# Patient Record
Sex: Female | Born: 1965 | Race: White | Hispanic: No | Marital: Married | Smoking: Current every day smoker
Health system: Southern US, Community
[De-identification: ages and names within clinical notes are randomized; demographics above are authoritative.]

## PROBLEM LIST (undated history)

## (undated) DIAGNOSIS — F419 Anxiety disorder, unspecified: Secondary | ICD-10-CM

## (undated) DIAGNOSIS — I10 Essential (primary) hypertension: Secondary | ICD-10-CM

## (undated) DIAGNOSIS — E785 Hyperlipidemia, unspecified: Secondary | ICD-10-CM

## (undated) HISTORY — PX: WISDOM TOOTH EXTRACTION: SHX21

## (undated) HISTORY — PX: TUBAL LIGATION: SHX77

## (undated) HISTORY — DX: Hyperlipidemia, unspecified: E78.5

## (undated) HISTORY — DX: Essential (primary) hypertension: I10

## (undated) HISTORY — DX: Anxiety disorder, unspecified: F41.9

---

## 2004-05-17 ENCOUNTER — Emergency Department (HOSPITAL_COMMUNITY): Admission: EM | Admit: 2004-05-17 | Discharge: 2004-05-17 | Payer: Self-pay | Admitting: *Deleted

## 2004-10-23 ENCOUNTER — Inpatient Hospital Stay (HOSPITAL_COMMUNITY): Admission: AD | Admit: 2004-10-23 | Discharge: 2004-10-25 | Payer: Self-pay | Admitting: Obstetrics

## 2004-10-27 ENCOUNTER — Observation Stay (HOSPITAL_COMMUNITY): Admission: AD | Admit: 2004-10-27 | Discharge: 2004-10-28 | Payer: Self-pay | Admitting: Obstetrics

## 2004-10-29 ENCOUNTER — Observation Stay (HOSPITAL_COMMUNITY): Admission: AD | Admit: 2004-10-29 | Discharge: 2004-10-30 | Payer: Self-pay | Admitting: Obstetrics

## 2005-04-12 ENCOUNTER — Emergency Department (HOSPITAL_COMMUNITY): Admission: EM | Admit: 2005-04-12 | Discharge: 2005-04-12 | Payer: Self-pay | Admitting: Emergency Medicine

## 2006-12-15 ENCOUNTER — Emergency Department (HOSPITAL_COMMUNITY): Admission: EM | Admit: 2006-12-15 | Discharge: 2006-12-15 | Payer: Self-pay | Admitting: Emergency Medicine

## 2013-04-10 ENCOUNTER — Ambulatory Visit (INDEPENDENT_AMBULATORY_CARE_PROVIDER_SITE_OTHER): Payer: BC Managed Care – PPO | Admitting: Obstetrics

## 2013-04-10 ENCOUNTER — Other Ambulatory Visit: Payer: Self-pay | Admitting: Obstetrics

## 2013-04-10 ENCOUNTER — Encounter: Payer: Self-pay | Admitting: Obstetrics

## 2013-04-10 VITALS — BP 132/78 | HR 72 | Temp 97.5°F | Ht 64.0 in | Wt 118.0 lb

## 2013-04-10 DIAGNOSIS — Z01419 Encounter for gynecological examination (general) (routine) without abnormal findings: Secondary | ICD-10-CM

## 2013-04-10 DIAGNOSIS — N63 Unspecified lump in unspecified breast: Secondary | ICD-10-CM

## 2013-04-10 DIAGNOSIS — N760 Acute vaginitis: Secondary | ICD-10-CM

## 2013-04-10 DIAGNOSIS — Z0000 Encounter for general adult medical examination without abnormal findings: Secondary | ICD-10-CM

## 2013-04-10 DIAGNOSIS — D649 Anemia, unspecified: Secondary | ICD-10-CM

## 2013-04-10 LAB — CBC
HCT: 44.2 % (ref 36.0–46.0)
Hemoglobin: 15.1 g/dL — ABNORMAL HIGH (ref 12.0–15.0)
MCH: 30.9 pg (ref 26.0–34.0)
MCHC: 34.2 g/dL (ref 30.0–36.0)
MCV: 90.4 fL (ref 78.0–100.0)
Platelets: 306 10*3/uL (ref 150–400)
RBC: 4.89 MIL/uL (ref 3.87–5.11)
RDW: 14.7 % (ref 11.5–15.5)
WBC: 9.5 10*3/uL (ref 4.0–10.5)

## 2013-04-10 LAB — COMPREHENSIVE METABOLIC PANEL
ALT: 12 U/L (ref 0–35)
AST: 23 U/L (ref 0–37)
Albumin: 4.9 g/dL (ref 3.5–5.2)
Alkaline Phosphatase: 73 U/L (ref 39–117)
BUN: 9 mg/dL (ref 6–23)
CO2: 26 mEq/L (ref 19–32)
Calcium: 10 mg/dL (ref 8.4–10.5)
Chloride: 100 mEq/L (ref 96–112)
Creat: 0.67 mg/dL (ref 0.50–1.10)
Glucose, Bld: 73 mg/dL (ref 70–99)
Potassium: 4.6 mEq/L (ref 3.5–5.3)
Sodium: 137 mEq/L (ref 135–145)
Total Bilirubin: 0.4 mg/dL (ref 0.3–1.2)
Total Protein: 7.2 g/dL (ref 6.0–8.3)

## 2013-04-10 MED ORDER — PRENATAL PLUS 27-1 MG PO TABS: 1.0000 | ORAL_TABLET | Freq: Every day | ORAL | Status: DC

## 2013-04-10 NOTE — Progress Notes (Signed)
Subjective:     Patricia Andersen is a 47 y.o. female here for a routine exam.  Current complaints: tender right breast mass x 2 days.     Gynecologic History No LMP recorded. LMP 01/28/2013 Contraception: tubal ligation Last Pap: unknown. Results were: normal Last mammogram: n/a. Results were: n/a    The following portions of the patient's history were reviewed and updated as appropriate: allergies, current medications, past family history, past medical history, past social history, past surgical history and problem list.  Review of Systems Pertinent items are noted in HPI.    Objective:    General appearance: alert and no distress Breasts: Right breast mass.  10 o'clock, soft, mobile, NT, outer quadrant. Abdomen: normal findings: soft, non-tender Pelvic: cervix normal in appearance, external genitalia normal, no adnexal masses or tenderness, no cervical motion tenderness, uterus normal size, shape, and consistency and vagina normal without discharge    Assessment:    Healthy female exam.   Right breast lump.   Plan:    Education reviewed: calcium supplements and Breast lump management.. Mammogram ordered. Follow up in: prn year. [facility].

## 2013-04-10 NOTE — Addendum Note (Signed)
Addended by: Julaine Hua on: 04/10/2013 04:51 PM   Modules accepted: Orders

## 2013-04-10 NOTE — Addendum Note (Signed)
Addended by: Julaine Hua on: 04/10/2013 05:38 PM   Modules accepted: Orders

## 2013-04-11 LAB — PAP IG W/ RFLX HPV ASCU

## 2013-04-11 LAB — WET PREP BY MOLECULAR PROBE
Candida species: NEGATIVE
Gardnerella vaginalis: NEGATIVE
Trichomonas vaginosis: NEGATIVE

## 2013-04-11 LAB — VITAMIN D 25 HYDROXY (VIT D DEFICIENCY, FRACTURES): Vit D, 25-Hydroxy: 43 ng/mL (ref 30–89)

## 2013-04-11 LAB — TSH: TSH: 0.966 u[IU]/mL (ref 0.350–4.500)

## 2013-04-14 ENCOUNTER — Encounter: Payer: Self-pay | Admitting: Obstetrics & Gynecology

## 2013-04-22 ENCOUNTER — Ambulatory Visit: Payer: Self-pay | Admitting: Obstetrics

## 2013-04-23 ENCOUNTER — Ambulatory Visit
Admission: RE | Admit: 2013-04-23 | Discharge: 2013-04-23 | Disposition: A | Discharge status: Home / Self Care | Payer: BC Managed Care – PPO | Source: Ambulatory Visit | Attending: Obstetrics | Admitting: Obstetrics

## 2013-04-23 DIAGNOSIS — N63 Unspecified lump in unspecified breast: Secondary | ICD-10-CM

## 2013-05-13 ENCOUNTER — Encounter: Payer: Self-pay | Admitting: Obstetrics

## 2014-03-30 ENCOUNTER — Other Ambulatory Visit: Payer: Self-pay

## 2014-03-30 DIAGNOSIS — Z1231 Encounter for screening mammogram for malignant neoplasm of breast: Secondary | ICD-10-CM

## 2014-04-30 ENCOUNTER — Ambulatory Visit
Admission: RE | Admit: 2014-04-30 | Discharge: 2014-04-30 | Disposition: A | Discharge status: Home / Self Care | Payer: BC Managed Care – PPO | Source: Ambulatory Visit

## 2014-04-30 DIAGNOSIS — Z1231 Encounter for screening mammogram for malignant neoplasm of breast: Secondary | ICD-10-CM

## 2014-05-11 ENCOUNTER — Encounter: Payer: Self-pay | Admitting: Obstetrics

## 2014-05-11 ENCOUNTER — Ambulatory Visit (INDEPENDENT_AMBULATORY_CARE_PROVIDER_SITE_OTHER): Payer: BC Managed Care – PPO | Admitting: Obstetrics

## 2014-05-11 VITALS — BP 128/77 | HR 62 | Temp 98.8°F | Ht 64.0 in | Wt 120.0 lb

## 2014-05-11 DIAGNOSIS — D509 Iron deficiency anemia, unspecified: Secondary | ICD-10-CM

## 2014-05-11 DIAGNOSIS — Z01419 Encounter for gynecological examination (general) (routine) without abnormal findings: Secondary | ICD-10-CM

## 2014-05-11 DIAGNOSIS — R52 Pain, unspecified: Secondary | ICD-10-CM

## 2014-05-11 DIAGNOSIS — Z716 Tobacco abuse counseling: Secondary | ICD-10-CM

## 2014-05-11 MED ORDER — VARENICLINE TARTRATE 1 MG PO TABS: 1.0000 mg | ORAL_TABLET | Freq: Two times a day (BID) | ORAL | Status: DC

## 2014-05-11 MED ORDER — IBUPROFEN 800 MG PO TABS: 800.0000 mg | ORAL_TABLET | Freq: Three times a day (TID) | ORAL | Status: DC | PRN

## 2014-05-11 MED ORDER — VARENICLINE TARTRATE 0.5 MG X 11 & 1 MG X 42 PO MISC: ORAL | Status: DC

## 2014-05-11 MED ORDER — PRENATAL PLUS 27-1 MG PO TABS: 1.0000 | ORAL_TABLET | Freq: Every day | ORAL | Status: DC

## 2014-05-11 MED ORDER — VARENICLINE TARTRATE 1 MG PO TABS
1.0000 mg | ORAL_TABLET | Freq: Two times a day (BID) | ORAL | Status: DC
Start: 2014-05-11 — End: 2014-05-11

## 2014-05-12 ENCOUNTER — Encounter: Payer: Self-pay | Admitting: Obstetrics

## 2014-05-12 LAB — PAP IG AND HPV HIGH-RISK: HPV DNA High Risk: NOT DETECTED

## 2014-05-12 LAB — WET PREP BY MOLECULAR PROBE
Candida species: NEGATIVE
Gardnerella vaginalis: NEGATIVE
Trichomonas vaginosis: NEGATIVE

## 2014-05-12 NOTE — Progress Notes (Signed)
Subjective:     Patricia Andersen is a 48 y.o. female here for a routine exam.  Current complaints: None.    Personal health questionnaire:  Is patient [name], have a family history of breast and/or ovarian cancer: no Is there a family history of uterine cancer diagnosed at age < 86, gastrointestinal cancer, urinary tract cancer, family member who is a Field seismologist syndrome-associated carrier: no Is the patient overweight and hypertensive, family history of diabetes, personal history of gestational diabetes or PCOS: no Is patient over 92, have PCOS,  family history of premature CHD under age 45, diabetes, smoke, have hypertension or peripheral artery disease:  no  The HPI was reviewed and explored in further detail by the provider. Gynecologic History No LMP recorded. [name]. Contraception: tubal ligation Last Pap: 2014. Results were: normal Last mammogram: 2014. Results were: normal  Obstetric History OB History  Gravida Para Term Preterm AB SAB TAB Ectopic Multiple Living  5 4 3 1 1 1    3     # Outcome Date GA Lbr Len/2nd Weight Sex Delivery Anes PTL Lv  5 TRM 10/23/04 [redacted]w[redacted]d  6 lb 5 oz (2.863 kg) F LTCS Spinal  Y  4 TRM 02/18/90 [redacted]w[redacted]d  6 lb (2.722 kg) F LTCS Spinal  Y  3 PRE 1990 [redacted]w[redacted]d    SVD  Y ND  2 TRM 04/30/86 [redacted]w[redacted]d  6 lb (2.722 kg) M SVD EPI  Y  1 SAB               Past Medical History  Diagnosis Date  . Hypertension     Past Surgical History  Procedure Laterality Date  . Tubal ligation    . Wisdom tooth extraction    . Cesarean section      x 2    Current outpatient prescriptions:Cimetidine (ACID REDUCER PO), Take 1 tablet by mouth., Disp: , Rfl: ;  ibuprofen (ADVIL,MOTRIN) 200 MG tablet, Take 200 mg by mouth every 6 (six) hours as needed for pain., Disp: , Rfl: ;  prenatal vitamin w/FE, FA (PRENATAL 1 + 1) 27-1 MG TABS tablet, Take 1 tablet by mouth daily at 12 noon., Disp: 30 each, Rfl: 11 ibuprofen (ADVIL,MOTRIN) 800 MG tablet, Take 1 tablet  (800 mg total) by mouth every 8 (eight) hours as needed., Disp: 30 tablet, Rfl: 5;  varenicline (CHANTIX CONTINUING MONTH PAK) 1 MG tablet, Take 1 tablet (1 mg total) by mouth 2 (two) times daily., Disp: 60 tablet, Rfl: 1 varenicline (CHANTIX STARTING MONTH PAK) 0.5 MG X 11 & 1 MG X 42 tablet, Take one 0.5 mg tablet by mouth once daily for 3 days, then increase to one 0.5 mg tablet twice daily for 4 days, then increase to one 1 mg tablet twice daily., Disp: 53 tablet, Rfl: 2 varenicline (CHANTIX STARTING MONTH PAK) 0.5 MG X 11 & 1 MG X 42 tablet, Take one 0.5 mg tablet by mouth once daily for 3 days, then increase to one 0.5 mg tablet twice daily for 4 days, then increase to one 1 mg tablet twice daily., Disp: 53 tablet, Rfl: 0 No Known Allergies  History  Substance Use Topics  . Smoking status: Current Every Day Smoker -- 1.00 packs/day    Types: Cigarettes  . Smokeless tobacco: Never Used  . Alcohol Use: No    Family History  Problem Relation Age of Onset  . Cancer Father   . Heart attack Mother   . Hyperlipidemia Mother   .  Hypertension    . Heart disease Brother   . Cancer Sister       Review of Systems  Constitutional: negative for fatigue and weight loss Respiratory: negative for cough and wheezing Cardiovascular: negative for chest pain, fatigue and palpitations Gastrointestinal: negative for abdominal pain and change in bowel habits Musculoskeletal:negative for myalgias Neurological: negative for gait problems and tremors Behavioral/Psych: negative for abusive relationship, depression Endocrine: negative for temperature intolerance   Genitourinary:negative for abnormal menstrual periods, genital lesions, hot flashes, sexual problems and vaginal discharge Integument/breast: negative for breast lump, breast tenderness, nipple discharge and skin lesion(s)    Objective:       BP 128/77  Pulse 62  Temp(Src) 98.8 F (37.1 C)  Ht 5\' 4"  (1.626 m)  Wt 120 lb (54.432 kg)   BMI 20.59 kg/m2 General:   alert  Skin:   no rash or abnormalities  Lungs:   clear to auscultation bilaterally  Heart:   regular rate and rhythm, S1, S2 normal, no murmur, click, rub or gallop  Breasts:   normal without suspicious masses, skin or nipple changes or axillary nodes  Abdomen:  normal findings: no organomegaly, soft, non-tender and no hernia  Pelvis:  External genitalia: normal general appearance Urinary system: urethral meatus normal and bladder without fullness, nontender Vaginal: normal without tenderness, induration or masses Cervix: normal appearance Adnexa: normal bimanual exam Uterus: anteverted and non-tender, normal size   Lab Review Urine pregnancy test Labs reviewed yes Radiologic studies reviewed yes    Assessment:    Healthy female exam.    Plan:    Education reviewed: calcium supplements, low fat, low cholesterol diet, safe sex/STD prevention, self breast exams and weight bearing exercise. Mammogram ordered. Follow up in: 1 year.   Meds ordered this encounter  Medications  . DISCONTD: varenicline (CHANTIX STARTING MONTH PAK) 0.5 MG X 11 & 1 MG X 42 tablet    Sig: Take one 0.5 mg tablet by mouth once daily for 3 days, then increase to one 0.5 mg tablet twice daily for 4 days, then increase to one 1 mg tablet twice daily.    Dispense:  53 tablet    Refill:  0  . DISCONTD: varenicline (CHANTIX CONTINUING MONTH PAK) 1 MG tablet    Sig: Take 1 tablet (1 mg total) by mouth 2 (two) times daily.    Dispense:  60 tablet    Refill:  1  . prenatal vitamin w/FE, FA (PRENATAL 1 + 1) 27-1 MG TABS tablet    Sig: Take 1 tablet by mouth daily at 12 noon.    Dispense:  30 each    Refill:  11  . ibuprofen (ADVIL,MOTRIN) 800 MG tablet    Sig: Take 1 tablet (800 mg total) by mouth every 8 (eight) hours as needed.    Dispense:  30 tablet    Refill:  5  . DISCONTD: varenicline (CHANTIX STARTING MONTH PAK) 0.5 MG X 11 & 1 MG X 42 tablet    Sig: Take one 0.5 mg  tablet by mouth once daily for 3 days, then increase to one 0.5 mg tablet twice daily for 4 days, then increase to one 1 mg tablet twice daily.    Dispense:  53 tablet    Refill:  0  . DISCONTD: varenicline (CHANTIX CONTINUING MONTH PAK) 1 MG tablet    Sig: Take 1 tablet (1 mg total) by mouth 2 (two) times daily.    Dispense:  60 tablet  Refill:  1  . DISCONTD: varenicline (CHANTIX CONTINUING MONTH PAK) 1 MG tablet    Sig: Take 1 tablet (1 mg total) by mouth 2 (two) times daily.    Dispense:  60 tablet    Refill:  1  . DISCONTD: varenicline (CHANTIX STARTING MONTH PAK) 0.5 MG X 11 & 1 MG X 42 tablet    Sig: Take one 0.5 mg tablet by mouth once daily for 3 days, then increase to one 0.5 mg tablet twice daily for 4 days, then increase to one 1 mg tablet twice daily.    Dispense:  53 tablet    Refill:  0  . DISCONTD: varenicline (CHANTIX CONTINUING MONTH PAK) 1 MG tablet    Sig: Take 1 tablet (1 mg total) by mouth 2 (two) times daily.    Dispense:  60 tablet    Refill:  1  . varenicline (CHANTIX STARTING MONTH PAK) 0.5 MG X 11 & 1 MG X 42 tablet    Sig: Take one 0.5 mg tablet by mouth once daily for 3 days, then increase to one 0.5 mg tablet twice daily for 4 days, then increase to one 1 mg tablet twice daily.    Dispense:  53 tablet    Refill:  2  . varenicline (CHANTIX CONTINUING MONTH PAK) 1 MG tablet    Sig: Take 1 tablet (1 mg total) by mouth 2 (two) times daily.    Dispense:  60 tablet    Refill:  1  . varenicline (CHANTIX STARTING MONTH PAK) 0.5 MG X 11 & 1 MG X 42 tablet    Sig: Take one 0.5 mg tablet by mouth once daily for 3 days, then increase to one 0.5 mg tablet twice daily for 4 days, then increase to one 1 mg tablet twice daily.    Dispense:  53 tablet    Refill:  0   Orders Placed This Encounter  Procedures  . WET PREP BY MOLECULAR PROBE

## 2014-05-19 ENCOUNTER — Telehealth: Payer: Self-pay | Admitting: *Deleted

## 2014-05-19 NOTE — Telephone Encounter (Signed)
Patient called regarding lab results.   Patient notified that all labs preformed were Negative.

## 2014-06-22 ENCOUNTER — Telehealth: Payer: Self-pay | Admitting: *Deleted

## 2014-06-22 NOTE — Telephone Encounter (Signed)
Pharmacy called stating they are unable to get the PNV prescribed for the patient. Pharmacy states they have been trying for a month and have been unsuccessful. Pharmacy states they have prenatal plus Iron on hand. Pharmacy notified that would be fine to prescribe for patient that it seems close to what was prescribed. Pharmacy states they will put the order in.

## 2014-08-31 ENCOUNTER — Encounter: Payer: Self-pay | Admitting: Obstetrics

## 2014-09-15 ENCOUNTER — Emergency Department (HOSPITAL_COMMUNITY)
Admission: EM | Admit: 2014-09-15 | Discharge: 2014-09-15 | Disposition: A | Discharge status: Home / Self Care | Payer: BC Managed Care – PPO | Attending: Emergency Medicine | Admitting: Emergency Medicine

## 2014-09-15 ENCOUNTER — Encounter (HOSPITAL_COMMUNITY): Payer: Self-pay

## 2014-09-15 ENCOUNTER — Emergency Department (HOSPITAL_COMMUNITY): Payer: BC Managed Care – PPO

## 2014-09-15 DIAGNOSIS — Z79899 Other long term (current) drug therapy: Secondary | ICD-10-CM | POA: Insufficient documentation

## 2014-09-15 DIAGNOSIS — R079 Chest pain, unspecified: Secondary | ICD-10-CM | POA: Diagnosis not present

## 2014-09-15 DIAGNOSIS — Z720 Tobacco use: Secondary | ICD-10-CM | POA: Insufficient documentation

## 2014-09-15 DIAGNOSIS — I10 Essential (primary) hypertension: Secondary | ICD-10-CM | POA: Diagnosis not present

## 2014-09-15 LAB — BASIC METABOLIC PANEL WITH GFR
Anion gap: 12 (ref 5–15)
BUN: 14 mg/dL (ref 6–23)
CO2: 27 meq/L (ref 19–32)
Calcium: 10.3 mg/dL (ref 8.4–10.5)
Chloride: 99 meq/L (ref 96–112)
Creatinine, Ser: 0.7 mg/dL (ref 0.50–1.10)
GFR calc Af Amer: >90 mL/min
GFR calc non Af Amer: >90 mL/min
Glucose, Bld: 109 mg/dL — ABNORMAL HIGH (ref 70–99)
Potassium: 4.1 meq/L (ref 3.7–5.3)
Sodium: 138 meq/L (ref 137–147)

## 2014-09-15 LAB — CBC
HCT: 45.3 % (ref 36.0–46.0)
Hemoglobin: 15.5 g/dL — ABNORMAL HIGH (ref 12.0–15.0)
MCH: 31.7 pg (ref 26.0–34.0)
MCHC: 34.2 g/dL (ref 30.0–36.0)
MCV: 92.6 fL (ref 78.0–100.0)
Platelets: 327 10*3/uL (ref 150–400)
RBC: 4.89 MIL/uL (ref 3.87–5.11)
RDW: 12.5 % (ref 11.5–15.5)
WBC: 10.1 10*3/uL (ref 4.0–10.5)

## 2014-09-15 LAB — I-STAT TROPONIN, ED
Troponin i, poc: 0 ng/mL (ref 0.00–0.08)
Troponin i, poc: 0 ng/mL (ref 0.00–0.08)

## 2014-09-15 MED ORDER — GI COCKTAIL ~~LOC~~
30.0000 mL | Freq: Once | ORAL | Status: AC
  Administered 2014-09-15: 30 mL via ORAL
  Filled 2014-09-15: qty 30

## 2014-09-15 MED ORDER — PANTOPRAZOLE SODIUM 20 MG PO TBEC: 20.0000 mg | DELAYED_RELEASE_TABLET | Freq: Every day | ORAL | Status: DC

## 2014-09-15 NOTE — ED Notes (Signed)
Per pt, woke up with chest pain this morning.  Pt continued.  Pt states pain eased off and she became nauseated.  Also having tingling in right arm.

## 2014-09-15 NOTE — Discharge Instructions (Signed)
Return to the emergency room with worsening of symptoms, new symptoms or with symptoms that are concerning, especially chest pain that feels like a pressure, spreads to left arm or jaw, worse with exertion, associated with nausea, vomiting, shortness of breath and/or sweating.  Start PPI. You can use protonix as a script or if OTC omeprazole 20mg  daily for 14 days is cheaper you may use that instead. Call to make an appointment with cardiology as soon as possible for close follow-up.  Please call your doctor for a followup appointment within 24-48 hours. When you talk to your doctor please let them know that you were seen in the emergency department and have them acquire all of your records so that they can discuss the findings with you and formulate a treatment plan to fully care for your new and ongoing problems. If you do not have a primary care provider please call the number below under ED resources to establish care with a provider and follow up.    Emergency Department Resource Guide 1) Find a Doctor and Pay Out of Pocket Although you won't have to find out who is covered by your insurance plan, it is a good idea to ask around and get recommendations. You will then need to call the office and see if the doctor you have chosen will accept you as a new patient and what types of options they offer for patients who are self-pay. Some doctors offer discounts or will set up payment plans for their patients who do not have insurance, but you will need to ask so you aren't surprised when you get to your appointment.  2) Contact Your Local Health Department Not all health departments have doctors that can see patients for sick visits, but many do, so it is worth a call to see if yours does. If you don't know where your local health department is, you can check in your phone book. The CDC also has a tool to help you locate your state's health department, and many state websites also have listings of all of  their local health departments.  3) Find a [facility] If your illness is not likely to be very severe or complicated, you may want to try a walk in clinic. These are popping up all over the country in pharmacies, drugstores, and shopping centers. They're usually staffed by nurse practitioners or physician assistants that have been trained to treat common illnesses and complaints. They're usually fairly quick and inexpensive. However, if you have serious medical issues or chronic medical problems, these are probably not your best option.  No Primary Care Doctor: - Call Health Connect at  [phone] - they can help you locate a primary care doctor that  accepts your insurance, provides certain services, etc. - Physician Referral Service- [phone]  Chronic Pain Problems: Organization         Address  Phone   Notes  [facility]  [phone] Patients need to be referred by their primary care doctor.   Medication Assistance: Organization         Address  Phone   Notes  [facility] Medication [facility] [address] [place], [place] [address] [phone] --Must be a resident of [facility] -- Must have NO insurance coverage whatsoever (no Medicaid/ Medicare, etc.) -- The pt. MUST have a primary care doctor that directs their care regularly and follows them in the community   MedAssist  [phone]  [employer]  [phone]    Agencies that provide inexpensive medical care: Organization         Address  Phone   Notes  [place]  [phone]   [facility]    [phone]   [facility] [place], [place] [address] [phone]   [place] [address], [place] [phone]   Planned Parenthood    [phone]   [facility]    [phone]   [place] and [address],  [place] Phone:  [phone], Fax:  [phone] Hours of Operation:  9 am - 6 pm, M-F.  Also accepts Medicaid/Medicare and self-pay.  [facility] for [place] [address], [address], [place] Phone: [phone], Fax: [phone]. Hours of Operation:  8:30 am - 5:30 pm, M-F.  Also accepts Medicaid and self-pay.  [facility] [address], [place] Phone: [phone]   [place], [place], [place] [phone], Ext. [phone] Mondays & Thursdays: 7-9 AM.  First 15 patients are seen on a first come, first serve basis.    [place] Providers:  Organization         Address  Phone   Notes  [facility] [address], [place] [phone] Also accepts self-pay patients.  [facility] [address], [place]  [phone]   [place], [address], [place] [phone]   [facility] [address] [place] [phone]   Lucianne Lei [address], [place]   [phone] Only accepts [place] Access [place] patients after they have their name applied to their card.   Self-Pay (no insurance) in [facility]:  Organization         Address  Phone   Notes  Sickle Cell Patients, [facility] Internal [facility] [place] [phone]   [facility] [place] [phone]   [facility] [place]  [place], [place], [place] [phone]   [facility]/Dr. Osei-Bonsu  [address] [place] or [address], [place] [phone] Phone number for both [place] and [place] locations is the same.  [facility] [address], [place] [phone]   [facility] [address], [place] or [address] [phone]   [facility] [address], [place] [phone], phone; [phone], fax Sees patients 1st and 3rd Saturday of every month.  Must not qualify for public or private insurance (i.e. Medicaid, Medicare, [place] Health Choice, Veterans' Benefits)  Household income should be no more than 200% of the poverty level The clinic cannot treat you if you are pregnant or think you are pregnant  Sexually transmitted diseases are not treated at the clinic.    Dental Care: Organization         Address  Phone  Notes  [facility] Department of [facility] [phone] Accepts children up to age 57 who are enrolled in [place] or [place]; pregnant women with a Medicaid card;  and children who have applied for Medicaid or [place] Health Choice, but were declined, whose parents can pay a reduced fee at time of service.  [facility] Department of [facility]  [address], [place] [phone] Accepts children up to age 42 who are enrolled in [place] or [place]; pregnant women with a Medicaid card; and children who have applied for Medicaid or [place] Health Choice, but were declined, whose parents can pay a reduced fee at time of service.  [place] [facility] PRO[facility]  [place] [phone] Patients are seen by appointment only. Walk-ins are not accepted. [place] will see patients 65 years of age and older. Monday - Tuesday (8am-5pm) Most Wednesdays (8:30-5pm) $30 per visit, cash only  [facility] Adult Dental Access PROGRAM  [address], [facility] [phone] Patients are seen by appointment only. Walk-ins are not accepted. [place] will see patients 36 years of age and older. One Wednesday Evening (Monthly: Volunteer Based).  $30 per visit, cash only  [place]   [phone] for adults; Children under age 11, call Graduate Pediatric Dentistry at [phone]. Children aged 109-14, please call [phone] to request a pediatric application.  Dental services are provided in all areas of dental care including fillings, crowns and bridges, complete and partial dentures, implants, gum treatment, root canals, and extractions. Preventive care is also provided. Treatment is provided to both adults and children. Patients are selected via a lottery and there is often a waiting list.   [facility] [address], [place]  [phone] [email or web address]   [facility] [address] [place], [place] [phone], Ext. [phone] Second and Fourth Thursday of each month, opens at 6:30 AM; Clinic ends at 9 AM.  Patients are seen on a first-come first-served basis, and a limited number are seen during each clinic.   [facility]  [address] Hillard Danker [place], [place] [phone]   Eligibility Requirements You must have lived in [place], [place], or [place] counties for at least the last three months.   You cannot be eligible for state or federal sponsored [employer], including [employer], [place], or [employer].   You generally cannot be eligible for healthcare insurance through your employer.    How to apply: Eligibility screenings are held every Tuesday and Wednesday afternoon from 1:00 pm until 4:00 pm. You do not need an appointment for the interview!  [facility] [address] [place], [place]   [place]  [address] Department  [place]  [phone]    Behavioral Health Resources in the Community: Intensive Outpatient Programs Organization         Address  Phone  Notes  [place]. [address] [place], [place] [phone]   [facility] [address], [place], [place]   ADS: Alcohol & Drug Svcs [address], [place], [place]   [place] [address]  [place], [place] or [phone]   Substance Abuse Resources Organization         Address  Phone  Notes  Alcohol and Drug Services  [place]  [phone]   [facility] [place]   [facility]  [phone]  Residential & Outpatient Substance Abuse Program  [phone]   Psychological Services Organization         Address  Phone  Notes  [facility] [place]  [place]  [phone]   [place] [phone] [address] [place] or [phone]    Mobile Crisis Teams Organization         Address  Phone  Notes  [facility], Mobile Crisis Care Unit  [phone]   [facility]  [address] [place], [place]   [place] [address] [place] [phone]    Self-Help/Support Groups Organization         Address  Phone             Notes  [place]. of [place] - variety of support groups  [place] Call for more information  Narcotics Anonymous (NA), [facility] [address], [employer] [place]  2 meetings at this location   Special educational needs teacher         Address  Phone  Notes  [facility],    [place]  [phone]   [facility]  [address] [place] [phone], [place], [place]   [place], [place] [phone] Admissions: 8am-3pm M-F  Incentives Substance [place] [address],    [place], [place] [phone]   [facility] [address] [place], [place], [place]   [facility] [address]  [place], [place]     [facility] - Intensive Outpatient [address], [place], [place]   [facility] [address]  [place], [place] [phone] or [phone]   [facility] ([facility]) [address], [place], [place] Accepts Medicaid  [facility] [place] [address],  [place] [phone] Substance Abuse/Addiction Treatment   [facility] Organization         Address  Phone  Notes  [facility]  [phone]   Domenic Schwab, PhD [address] Arlis Porta [place], [place]   [phone] or [phone]   [place], [place] [phone]   [facility] [address] [place], [place] [phone] Insurance/Medicaid/sponsorship through [place] [facility] and Families [address], [address] [place]                                    [place], [place] [phone] [place] [address], [place] [phone]    Dr. Adele Schilder  [phone]   [facility] Dept. 1) [address] [place] 2) [place] 3)  [place] [address], [place] [phone]  [phone]   [place] [phone] or [phone] (After Hours)       Chest Pain (Nonspecific) It is often hard to give a specific diagnosis for the cause of chest pain. There is always a chance that your pain could be related to something serious, such as a heart attack or a blood clot in the lungs. You need to follow up with your health care provider for further  evaluation. CAUSES   Heartburn.  Pneumonia or bronchitis.  Anxiety or stress.  Inflammation around your heart (pericarditis) or lung (pleuritis or pleurisy).  A blood clot in the lung.  A collapsed lung (pneumothorax). It can develop suddenly on its own  (spontaneous pneumothorax) or from trauma to the chest.  Shingles infection (herpes zoster virus). The chest wall is composed of bones, muscles, and cartilage. Any of these can be the source of the pain.  The bones can be bruised by injury.  The muscles or cartilage can be strained by coughing or overwork.  The cartilage can be affected by inflammation and become sore (costochondritis). DIAGNOSIS  Lab tests or other studies may be needed to find the cause of your pain. Your health care provider may have you take a test called an ambulatory electrocardiogram (ECG). An ECG records your heartbeat patterns over a 24-hour period. You may also have other tests, such as:  Transthoracic echocardiogram (TTE). During echocardiography, sound waves are used to evaluate how blood flows through your heart.  Transesophageal echocardiogram (TEE).  Cardiac monitoring. This allows your health care provider to monitor your heart rate and rhythm in real time.  Holter monitor. This is a portable device that records your heartbeat and can help diagnose heart arrhythmias. It allows your health care provider to track your heart activity for several days, if needed.  Stress tests by exercise or by giving medicine that makes the heart beat faster. TREATMENT   Treatment depends on what may be causing your chest pain. Treatment may include:  Acid blockers for heartburn.  Anti-inflammatory medicine.  Pain medicine for inflammatory conditions.  Antibiotics if an infection is present.  You may be advised to change lifestyle habits. This includes stopping smoking and avoiding alcohol, caffeine, and chocolate.  You may be advised to keep your head raised (elevated) when sleeping. This reduces the chance of acid going backward from your stomach into your esophagus. Most of the time, nonspecific chest pain will improve within 2-3 days with rest and mild pain medicine.  HOME CARE INSTRUCTIONS   If antibiotics  were prescribed, take them as directed. Finish them even if you start to feel better.  For the next few days, avoid physical activities that bring on chest pain. Continue physical activities as directed.  Do not use any tobacco products, including cigarettes, chewing tobacco, or electronic cigarettes.  Avoid drinking alcohol.  Only take medicine as directed by your health care provider.  Follow your health care provider's suggestions for further testing if your chest pain does not go away.  Keep any follow-up appointments you made. If you do not go to an appointment, you could develop lasting (chronic) problems with pain. If there is any problem keeping an appointment, call to reschedule. SEEK MEDICAL CARE IF:   Your chest pain does not go away, even after treatment.  You have a rash with blisters on your chest.  You have a fever. SEEK IMMEDIATE MEDICAL CARE IF:   You have increased chest pain or pain that spreads to your arm, neck, jaw, back, or abdomen.  You have shortness of breath.  You have an increasing cough, or you cough up blood.  You have severe back or abdominal pain.  You feel nauseous or vomit.  You have severe weakness.  You faint.  You have chills. This is an emergency. Do not wait to see if the pain will go away. Get medical help at once. Call your local emergency services (911 in  U.S.). Do not drive yourself to the hospital. MAKE SURE YOU:   Understand these instructions.  Will watch your condition.  Will get help right away if you are not doing well or get worse. Document Released: 07/26/2005 Document Revised: 10/21/2013 Document Reviewed: 05/21/2008 [facility] Patient Information 2015 [place], [place]. This information is not intended to replace advice given to you by your health care provider. Make sure you discuss any questions you have with your health care provider.

## 2014-09-15 NOTE — ED Provider Notes (Signed)
CSN: [id number]     Arrival date & time 09/15/14  1515 History   First MD Initiated Contact with Patient 09/15/14 1728     Chief Complaint  Patient presents with  . Chest Pain     (Consider location/radiation/quality/duration/timing/severity/associated sxs/prior Treatment) HPI  Patricia Andersen is a 48 y.o. female with PMH of HTN presenting with  left-sided chest pain that woke her from sleep around 4 AM this morning. Pain was sharp and now is an ache. Pain has been persistent with gradual improvement. Patient also stated at onset she felt nauseated but denies shortness of breath or diaphoresis. Pain improved with belching. She still with pain in ED. She denies worsening with activity, movement. She also has complaint of tingling in right arm that has resolved. Patient without a history of chest pain and has never been evaluated by cardiology. No history of dyslipidemia, diabetes, family history of sudden cardiac death. Patient smokes daily. No history of malignancy, no recent trauma or surgery, unilateral leg swelling or tenderness, hemoptysis, estrogen use.   Past Medical History  Diagnosis Date  . Hypertension    Past Surgical History  Procedure Laterality Date  . Tubal ligation    . Wisdom tooth extraction    . Cesarean section      x 2   Family History  Problem Relation Age of Onset  . Cancer Father   . Heart attack Mother   . Hyperlipidemia Mother   . Hypertension    . Heart disease Brother   . Cancer Sister    History  Substance Use Topics  . Smoking status: Current Every Day Smoker -- 1.00 packs/day    Types: Cigarettes  . Smokeless tobacco: Never Used  . Alcohol Use: No   OB History    Gravida Para Term Preterm AB TAB SAB Ectopic Multiple Living   5 4 3 1 1  1   3      Review of Systems  Constitutional: Negative for fever and chills.  HENT: Negative for congestion and rhinorrhea.   Eyes: Negative for visual disturbance.  Respiratory: Negative for cough and  shortness of breath.   Cardiovascular: Positive for chest pain. Negative for leg swelling.  Gastrointestinal: Negative for nausea, vomiting and diarrhea.  Musculoskeletal: Negative for back pain and gait problem.  Skin: Negative for rash.  Neurological: Negative for weakness and headaches.      Allergies  Review of patient's allergies indicates no known allergies.  Home Medications   Prior to Admission medications   Medication Sig Start Date End Date Taking? Authorizing Provider  Cimetidine (ACID REDUCER PO) Take 1 tablet by mouth daily.    Yes Historical Provider, MD  ibuprofen (ADVIL,MOTRIN) 800 MG tablet Take 1 tablet (800 mg total) by mouth every 8 (eight) hours as needed. 05/11/14  Yes Shelly Bombard, MD  prenatal vitamin w/FE, FA (PRENATAL 1 + 1) 27-1 MG TABS tablet Take 1 tablet by mouth daily at 12 noon. 05/11/14  Yes Shelly Bombard, MD  ibuprofen (ADVIL,MOTRIN) 200 MG tablet Take 200 mg by mouth every 6 (six) hours as needed for pain.    Historical Provider, MD  pantoprazole (PROTONIX) 20 MG tablet Take 1 tablet (20 mg total) by mouth daily. 09/15/14   Pura Spice, PA-C  varenicline (CHANTIX CONTINUING MONTH PAK) 1 MG tablet Take 1 tablet (1 mg total) by mouth 2 (two) times daily. 05/11/14   Shelly Bombard, MD  varenicline (CHANTIX STARTING MONTH PAK) 0.5 MG X  11 & 1 MG X 42 tablet Take one 0.5 mg tablet by mouth once daily for 3 days, then increase to one 0.5 mg tablet twice daily for 4 days, then increase to one 1 mg tablet twice daily. 05/11/14   Shelly Bombard, MD  varenicline (CHANTIX STARTING MONTH PAK) 0.5 MG X 11 & 1 MG X 42 tablet Take one 0.5 mg tablet by mouth once daily for 3 days, then increase to one 0.5 mg tablet twice daily for 4 days, then increase to one 1 mg tablet twice daily. 05/11/14   Shelly Bombard, MD   BP 123/64 mmHg  Pulse 57  Temp(Src) 98.4 F (36.9 C) (Oral)  Resp 18  SpO2 97% Physical Exam  Constitutional: She appears well-developed  and well-nourished. No distress.  HENT:  Head: Normocephalic and atraumatic.  Eyes: Conjunctivae and EOM are normal. Right eye exhibits no discharge. Left eye exhibits no discharge.  Neck: No JVD present.  Cardiovascular: Normal rate, regular rhythm and normal heart sounds.   No leg swelling or tenderness. Negative Homan's sign. Reproducible chest pain.  Pulmonary/Chest: Effort normal and breath sounds normal. No respiratory distress. She has no wheezes.  Abdominal: Soft. Bowel sounds are normal. She exhibits no distension. There is no tenderness.  Neurological: She is alert. She exhibits normal muscle tone. Coordination normal.  Skin: Skin is warm and dry. She is not diaphoretic.  Nursing note and vitals reviewed.   ED Course  Procedures (including critical care time) Labs Review Labs Reviewed  CBC - Abnormal; Notable for the following:    Hemoglobin 15.5 (*)    All other components within normal limits  BASIC METABOLIC PANEL - Abnormal; Notable for the following:    Glucose, Bld 109 (*)    All other components within normal limits  I-STAT TROPOININ, ED  I-STAT TROPOININ, ED    Imaging Review Dg Chest 2 View  09/15/2014   CLINICAL DATA:  Mid chest pain, right arm tingling, nausea since this morning  EXAM: CHEST - 2 VIEW  COMPARISON:  12/15/2006  FINDINGS: Lungs are clear. Prominent nipple shadows. Heart size and mediastinal contours are within normal limits. No effusion. Visualized skeletal structures are unremarkable.  IMPRESSION: No acute cardiopulmonary disease.   Electronically Signed   By: Arne Cleveland M.D.   On: 09/15/2014 19:06     EKG Interpretation   Date/Time:  Tuesday September 15 2014 15:21:53 EST Ventricular Rate:  87 PR Interval:  120 QRS Duration: 85 QT Interval:  354 QTC Calculation: 426 R Axis:   89 Text Interpretation:  Sinus rhythm Right atrial enlargement Probable LVH  with secondary repol abnrm Rate increased from prior,  Confirmed by  DOCHERTY   MD, [place] ([id number]) on 09/15/2014 3:28:03 PM      MDM   Final diagnoses:  Chest pain   Chest pain is not likely of cardiac or pulmonary etiology d/t presentation, PERC negative, low risk HEART score. VSS, no JVD or new murmur, RRR, breath sounds equal bilaterally, EKG without acute abnormalities, negative troponin and delta troponin, and negative CXR. Pt has been advised start a PPI and return to the ED if CP becomes exertional, associated with diaphoresis or nausea, radiates to left jaw/arm, worsens or becomes concerning in any way. Patient to follow up with cardiology for outpatient workup. ED resources provided. Pt appears reliable for follow up and is agreeable to discharge.   Discussed return precautions with patient. Discussed all results and patient verbalizes understanding and agrees with  plan.  This is a shared patient. This patient was discussed with the physician who saw and evaluated the patient and agrees with the plan.       Pura Spice, PA-C 09/15/14 1948  Ernestina Patches, MD 09/16/14 [phone]

## 2014-10-30 HISTORY — PX: BREAST CYST ASPIRATION: SHX578

## 2015-04-19 ENCOUNTER — Other Ambulatory Visit: Payer: Self-pay

## 2015-04-19 DIAGNOSIS — Z1231 Encounter for screening mammogram for malignant neoplasm of breast: Secondary | ICD-10-CM

## 2015-05-07 ENCOUNTER — Ambulatory Visit
Admission: RE | Admit: 2015-05-07 | Discharge: 2015-05-07 | Disposition: A | Discharge status: Home / Self Care | Payer: BLUE CROSS/BLUE SHIELD | Source: Ambulatory Visit

## 2015-05-07 ENCOUNTER — Other Ambulatory Visit: Payer: Self-pay | Admitting: Obstetrics

## 2015-05-07 DIAGNOSIS — Z1231 Encounter for screening mammogram for malignant neoplasm of breast: Secondary | ICD-10-CM

## 2015-05-07 DIAGNOSIS — R928 Other abnormal and inconclusive findings on diagnostic imaging of breast: Secondary | ICD-10-CM

## 2015-05-13 ENCOUNTER — Ambulatory Visit
Admission: RE | Admit: 2015-05-13 | Discharge: 2015-05-13 | Disposition: A | Discharge status: Home / Self Care | Payer: BLUE CROSS/BLUE SHIELD | Source: Ambulatory Visit | Attending: Obstetrics | Admitting: Obstetrics

## 2015-05-13 ENCOUNTER — Other Ambulatory Visit: Payer: Self-pay | Admitting: Obstetrics

## 2015-05-13 ENCOUNTER — Other Ambulatory Visit (HOSPITAL_COMMUNITY)
Admission: RE | Admit: 2015-05-13 | Discharge: 2015-05-13 | Disposition: A | Discharge status: Home / Self Care | Payer: BLUE CROSS/BLUE SHIELD | Source: Ambulatory Visit | Attending: Diagnostic Radiology | Admitting: Diagnostic Radiology

## 2015-05-13 DIAGNOSIS — N63 Unspecified lump in breast: Secondary | ICD-10-CM | POA: Insufficient documentation

## 2015-05-13 DIAGNOSIS — R928 Other abnormal and inconclusive findings on diagnostic imaging of breast: Secondary | ICD-10-CM

## 2015-05-17 ENCOUNTER — Other Ambulatory Visit: Payer: BLUE CROSS/BLUE SHIELD

## 2015-05-20 ENCOUNTER — Encounter: Payer: Self-pay | Admitting: Obstetrics

## 2015-05-20 ENCOUNTER — Ambulatory Visit (INDEPENDENT_AMBULATORY_CARE_PROVIDER_SITE_OTHER): Payer: BLUE CROSS/BLUE SHIELD | Admitting: Obstetrics

## 2015-05-20 ENCOUNTER — Ambulatory Visit: Payer: Self-pay | Admitting: Obstetrics

## 2015-05-20 VITALS — BP 142/81 | HR 67 | Temp 98.1°F | Ht 64.0 in | Wt 122.0 lb

## 2015-05-20 DIAGNOSIS — Z01419 Encounter for gynecological examination (general) (routine) without abnormal findings: Secondary | ICD-10-CM

## 2015-05-20 DIAGNOSIS — Z124 Encounter for screening for malignant neoplasm of cervix: Secondary | ICD-10-CM

## 2015-05-20 NOTE — Progress Notes (Signed)
Subjective:        Patricia Andersen is a 49 y.o. female here for a routine exam.  Current complaints: none.    Personal health questionnaire:  Is patient [name], have a family history of breast and/or ovarian cancer: no Is there a family history of uterine cancer diagnosed at age < 60, gastrointestinal cancer, urinary tract cancer, family member who is a Field seismologist syndrome-associated carrier: no Is the patient overweight and hypertensive, family history of diabetes, personal history of gestational diabetes, preeclampsia or PCOS: no Is patient over 24, have PCOS,  family history of premature CHD under age 64, diabetes, smoke, have hypertension or peripheral artery disease:  no At any time, has a partner hit, kicked or otherwise hurt or frightened you?: no Over the past 2 weeks, have you felt down, depressed or hopeless?: no Over the past 2 weeks, have you felt little interest or pleasure in doing things?:no   Gynecologic History No LMP recorded. [name]. Contraception: tubal ligation Last Pap: 2013. Results were: normal Last mammogram: 2016. Results were: benign cyst  Obstetric History OB History  Gravida Para Term Preterm AB SAB TAB Ectopic Multiple Living  5 4 3 1 1 1    3     # Outcome Date GA Lbr Len/2nd Weight Sex Delivery Anes PTL Lv  5 Term 10/23/04 [redacted]w[redacted]d  6 lb 5 oz (2.863 kg) F CS-LTranv Spinal  Y  4 Term 02/18/90 [redacted]w[redacted]d  6 lb (2.722 kg) F CS-LTranv Spinal  Y  3 Preterm 1990 [redacted]w[redacted]d    Vag-Spont  Y ND  2 Term 04/30/86 [redacted]w[redacted]d  6 lb (2.722 kg) M Vag-Spont EPI  Y  1 SAB               Past Medical History  Diagnosis Date  . Hypertension     Past Surgical History  Procedure Laterality Date  . Tubal ligation    . Wisdom tooth extraction    . Cesarean section      x 2     Current outpatient prescriptions:  .  Cimetidine (ACID REDUCER PO), Take 1 tablet by mouth daily. , Disp: , Rfl:  .  pantoprazole (PROTONIX) 20 MG tablet, Take 1 tablet (20 mg  total) by mouth daily., Disp: 14 tablet, Rfl: 0 .  prenatal vitamin w/FE, FA (PRENATAL 1 + 1) 27-1 MG TABS tablet, Take 1 tablet by mouth daily at 12 noon., Disp: 30 each, Rfl: 11 .  ibuprofen (ADVIL,MOTRIN) 200 MG tablet, Take 200 mg by mouth every 6 (six) hours as needed for pain., Disp: , Rfl:  .  ibuprofen (ADVIL,MOTRIN) 800 MG tablet, Take 1 tablet (800 mg total) by mouth every 8 (eight) hours as needed. (Patient not taking: Reported on 05/20/2015), Disp: 30 tablet, Rfl: 5 No Known Allergies  History  Substance Use Topics  . Smoking status: Current Every Day Smoker -- 1.00 packs/day    Types: Cigarettes  . Smokeless tobacco: Never Used  . Alcohol Use: No    Family History  Problem Relation Age of Onset  . Cancer Father   . Heart attack Mother   . Hyperlipidemia Mother   . Hypertension    . Heart disease Brother   . Cancer Sister       Review of Systems  Constitutional: negative for fatigue and weight loss Respiratory: negative for cough and wheezing Cardiovascular: negative for chest pain, fatigue and palpitations Gastrointestinal: negative for abdominal pain and change in bowel habits Musculoskeletal:negative  for myalgias Neurological: negative for gait problems and tremors Behavioral/Psych: negative for abusive relationship, depression Endocrine: negative for temperature intolerance   Genitourinary:negative for abnormal menstrual periods, genital lesions, hot flashes, sexual problems and vaginal discharge Integument/breast: negative for breast lump, breast tenderness, nipple discharge and skin lesion(s)    Objective:       BP 142/81 mmHg  Pulse 67  Temp(Src) 98.1 F (36.7 C)  Ht 5\' 4"  (1.626 m)  Wt 122 lb (55.339 kg)  BMI 20.93 kg/m2 General:   alert  Skin:   no rash or abnormalities  Lungs:   clear to auscultation bilaterally  Heart:   regular rate and rhythm, S1, S2 normal, no murmur, click, rub or gallop  Breasts:   normal without suspicious masses, skin or  nipple changes or axillary nodes  Abdomen:  normal findings: no organomegaly, soft, non-tender and no hernia  Pelvis:  External genitalia: normal general appearance Urinary system: urethral meatus normal and bladder without fullness, nontender Vaginal: normal without tenderness, induration or masses Cervix: normal appearance Adnexa: normal bimanual exam Uterus: anteverted and non-tender, normal size   Lab Review Urine pregnancy test Labs reviewed yes Radiologic studies reviewed yes    Assessment:    Healthy female exam.    Plan:    Education reviewed: calcium supplements, self breast exams, smoking cessation and weight bearing exercise. Follow up in: 1 year.   No orders of the defined types were placed in this encounter.   Orders Placed This Encounter  Procedures  . SureSwab, Vaginosis/Vaginitis Plus

## 2015-05-21 LAB — PAP IG AND HPV HIGH-RISK: HPV DNA High Risk: NOT DETECTED

## 2015-05-23 LAB — SURESWAB, VAGINOSIS/VAGINITIS PLUS
Atopobium vaginae: NOT DETECTED Log (cells/mL)
C. albicans, DNA: NOT DETECTED
C. glabrata, DNA: NOT DETECTED
C. parapsilosis, DNA: NOT DETECTED
C. trachomatis RNA, TMA: NOT DETECTED
C. tropicalis, DNA: NOT DETECTED
Gardnerella vaginalis: NOT DETECTED Log (cells/mL)
LACTOBACILLUS SPECIES: NOT DETECTED Log (cells/mL)
MEGASPHAERA SPECIES: NOT DETECTED Log (cells/mL)
N. gonorrhoeae RNA, TMA: NOT DETECTED
T. vaginalis RNA, QL TMA: NOT DETECTED

## 2015-06-07 ENCOUNTER — Other Ambulatory Visit: Payer: Self-pay | Admitting: Obstetrics

## 2015-09-21 ENCOUNTER — Other Ambulatory Visit: Payer: Self-pay | Admitting: Obstetrics

## 2015-10-20 ENCOUNTER — Other Ambulatory Visit: Payer: Self-pay | Admitting: Obstetrics

## 2015-12-16 ENCOUNTER — Other Ambulatory Visit: Payer: Self-pay | Admitting: Obstetrics

## 2016-01-24 ENCOUNTER — Other Ambulatory Visit: Payer: Self-pay | Admitting: Obstetrics

## 2016-01-26 NOTE — Telephone Encounter (Signed)
Please review for refill.  

## 2016-02-29 ENCOUNTER — Other Ambulatory Visit: Payer: Self-pay | Admitting: Obstetrics

## 2016-04-10 ENCOUNTER — Other Ambulatory Visit: Payer: Self-pay | Admitting: Obstetrics

## 2016-04-10 DIAGNOSIS — Z1231 Encounter for screening mammogram for malignant neoplasm of breast: Secondary | ICD-10-CM

## 2016-06-28 ENCOUNTER — Ambulatory Visit (INDEPENDENT_AMBULATORY_CARE_PROVIDER_SITE_OTHER): Payer: BLUE CROSS/BLUE SHIELD | Admitting: Obstetrics

## 2016-06-28 ENCOUNTER — Encounter: Payer: Self-pay | Admitting: Obstetrics

## 2016-06-28 VITALS — BP 148/75 | HR 82 | Ht 65.0 in | Wt 119.0 lb

## 2016-06-28 DIAGNOSIS — Z01419 Encounter for gynecological examination (general) (routine) without abnormal findings: Secondary | ICD-10-CM

## 2016-06-28 DIAGNOSIS — Z124 Encounter for screening for malignant neoplasm of cervix: Secondary | ICD-10-CM

## 2016-06-28 NOTE — Progress Notes (Signed)
Patient ID: Patricia Andersen, female   DOB: 08/15/1966, 50 y.o.   MRN: [id number]   Subjective:        Patricia Andersen is a 50 y.o. female here for a routine exam.  Current complaints: None.    Personal health questionnaire:  Is patient [name], have a family history of breast and/or ovarian cancer: no Is there a family history of uterine cancer diagnosed at age < 4, gastrointestinal cancer, urinary tract cancer, family member who is a Field seismologist syndrome-associated carrier: no Is the patient overweight and hypertensive, family history of diabetes, personal history of gestational diabetes, preeclampsia or PCOS: no Is patient over 49, have PCOS,  family history of premature CHD under age 81, diabetes, smoke, have hypertension or peripheral artery disease:  no At any time, has a partner hit, kicked or otherwise hurt or frightened you?: no Over the past 2 weeks, have you felt down, depressed or hopeless?: no Over the past 2 weeks, have you felt little interest or pleasure in doing things?:no   Gynecologic History No LMP recorded. [name]. Contraception: post menopausal status Last Pap: 2016. Results were: normal Last mammogram: 2017. Results were: normal  Obstetric History OB History  Gravida Para Term Preterm AB Living  5 4 3 1 1 3   SAB TAB Ectopic Multiple Live Births  1       4    # Outcome Date GA Lbr Len/2nd Weight Sex Delivery Anes PTL Lv  5 Term 10/23/04 [redacted]w[redacted]d  6 lb 5 oz (2.863 kg) F CS-LTranv Spinal  LIV  4 Term 02/18/90 [redacted]w[redacted]d  6 lb (2.722 kg) F CS-LTranv Spinal  LIV  3 Preterm 1990 [redacted]w[redacted]d    Vag-Spont  Y ND  2 Term 04/30/86 [redacted]w[redacted]d  6 lb (2.722 kg) M Vag-Spont EPI  LIV  1 SAB               Past Medical History:  Diagnosis Date  . Hypertension     Past Surgical History:  Procedure Laterality Date  . CESAREAN SECTION     x 2  . TUBAL LIGATION    . WISDOM TOOTH EXTRACTION       Current Outpatient Prescriptions:  .  pantoprazole (PROTONIX) 20  MG tablet, Take 1 tablet (20 mg total) by mouth daily., Disp: 14 tablet, Rfl: 0 .  Cimetidine (ACID REDUCER PO), Take 1 tablet by mouth daily. , Disp: , Rfl:  .  ibuprofen (ADVIL,MOTRIN) 200 MG tablet, Take 200 mg by mouth every 6 (six) hours as needed for pain., Disp: , Rfl:  .  ibuprofen (ADVIL,MOTRIN) 800 MG tablet, Take 1 tablet (800 mg total) by mouth every 8 (eight) hours as needed. (Patient not taking: Reported on 05/20/2015), Disp: 30 tablet, Rfl: 5 No Known Allergies  Social History  Substance Use Topics  . Smoking status: Current Every Day Smoker    Packs/day: 1.00    Types: Cigarettes  . Smokeless tobacco: Never Used  . Alcohol use No    Family History  Problem Relation Age of Onset  . Cancer Father   . Heart attack Mother   . Hyperlipidemia Mother   . Hypertension    . Heart disease Brother   . Cancer Sister       Review of Systems  Constitutional: negative for fatigue and weight loss Respiratory: negative for cough and wheezing Cardiovascular: negative for chest pain, fatigue and palpitations Gastrointestinal: negative for abdominal pain and change in bowel habits Musculoskeletal:negative for  myalgias Neurological: negative for gait problems and tremors Behavioral/Psych: negative for abusive relationship, depression Endocrine: negative for temperature intolerance   Genitourinary:negative for abnormal menstrual periods, genital lesions, hot flashes, sexual problems and vaginal discharge Integument/breast: negative for breast lump, breast tenderness, nipple discharge and skin lesion(s)    Objective:       BP (!) 148/75   Pulse 82   Ht 5\' 5"  (1.651 m)   Wt 119 lb (54 kg)   BMI 19.80 kg/m  General:   alert  Skin:   no rash or abnormalities  Lungs:   clear to auscultation bilaterally  Heart:   regular rate and rhythm, S1, S2 normal, no murmur, click, rub or gallop  Breasts:   normal without suspicious masses, skin or nipple changes or axillary nodes   Abdomen:  normal findings: no organomegaly, soft, non-tender and no hernia  Pelvis:  External genitalia: normal general appearance Urinary system: urethral meatus normal and bladder without fullness, nontender Vaginal: normal without tenderness, induration or masses Cervix: normal appearance Adnexa: normal bimanual exam Uterus: anteverted and non-tender, normal size   Lab Review Urine pregnancy test Labs reviewed yes Radiologic studies reviewed yes  50% of 20 min visit spent on counseling and coordination of care.   Assessment:    Healthy female exam.    Postmenopause.  Doing well.   Plan:    Education reviewed: calcium supplements, low fat, low cholesterol diet, self breast exams and weight bearing exercise. Contraception: post menopausal status. Follow up in: 1 year.   No orders of the defined types were placed in this encounter.  No orders of the defined types were placed in this encounter.

## 2016-07-01 LAB — PAP IG AND HPV HIGH-RISK
HPV, high-risk: NEGATIVE
PAP Smear Comment: 0

## 2016-07-04 ENCOUNTER — Telehealth: Payer: Self-pay | Admitting: Obstetrics

## 2016-07-04 NOTE — Telephone Encounter (Signed)
Call to patient- LM on VM- labs from visit look great - everything is normal. She can call back with questions if needed.

## 2016-07-04 NOTE — Telephone Encounter (Signed)
Pt wants to be notified of results. Please advise.

## 2016-07-05 ENCOUNTER — Ambulatory Visit: Payer: BLUE CROSS/BLUE SHIELD

## 2018-11-06 ENCOUNTER — Telehealth: Payer: Self-pay | Admitting: *Deleted

## 2018-11-06 NOTE — Telephone Encounter (Signed)
Pt called to office asking for order for [facility] due to knot in breast.  Return call to pt.  Pt made aware that she will need AEX appt in our office in order to address issue to get her an order for breast. Pt states she is going through a separation and will call back once she is able to schedule.

## 2018-11-28 ENCOUNTER — Encounter: Payer: Self-pay | Admitting: Obstetrics

## 2018-11-28 ENCOUNTER — Ambulatory Visit (INDEPENDENT_AMBULATORY_CARE_PROVIDER_SITE_OTHER): Payer: BLUE CROSS/BLUE SHIELD | Admitting: Obstetrics

## 2018-11-28 ENCOUNTER — Other Ambulatory Visit: Payer: Self-pay

## 2018-11-28 ENCOUNTER — Emergency Department (HOSPITAL_COMMUNITY)
Admission: EM | Admit: 2018-11-28 | Discharge: 2018-11-29 | Disposition: A | Discharge status: Home / Self Care | Payer: BLUE CROSS/BLUE SHIELD | Attending: Emergency Medicine | Admitting: Emergency Medicine

## 2018-11-28 ENCOUNTER — Encounter (HOSPITAL_COMMUNITY): Payer: Self-pay | Admitting: *Deleted

## 2018-11-28 VITALS — BP 146/92 | HR 61 | Ht 64.0 in | Wt 121.0 lb

## 2018-11-28 DIAGNOSIS — Z124 Encounter for screening for malignant neoplasm of cervix: Secondary | ICD-10-CM

## 2018-11-28 DIAGNOSIS — Z1151 Encounter for screening for human papillomavirus (HPV): Secondary | ICD-10-CM | POA: Diagnosis not present

## 2018-11-28 DIAGNOSIS — F329 Major depressive disorder, single episode, unspecified: Secondary | ICD-10-CM | POA: Diagnosis not present

## 2018-11-28 DIAGNOSIS — I10 Essential (primary) hypertension: Secondary | ICD-10-CM | POA: Diagnosis not present

## 2018-11-28 DIAGNOSIS — N898 Other specified noninflammatory disorders of vagina: Secondary | ICD-10-CM

## 2018-11-28 DIAGNOSIS — Z0000 Encounter for general adult medical examination without abnormal findings: Secondary | ICD-10-CM

## 2018-11-28 DIAGNOSIS — F419 Anxiety disorder, unspecified: Secondary | ICD-10-CM | POA: Diagnosis not present

## 2018-11-28 DIAGNOSIS — Z01419 Encounter for gynecological examination (general) (routine) without abnormal findings: Secondary | ICD-10-CM | POA: Diagnosis not present

## 2018-11-28 DIAGNOSIS — Z113 Encounter for screening for infections with a predominantly sexual mode of transmission: Secondary | ICD-10-CM

## 2018-11-28 DIAGNOSIS — Z79899 Other long term (current) drug therapy: Secondary | ICD-10-CM | POA: Insufficient documentation

## 2018-11-28 DIAGNOSIS — Z1239 Encounter for other screening for malignant neoplasm of breast: Secondary | ICD-10-CM

## 2018-11-28 DIAGNOSIS — F17210 Nicotine dependence, cigarettes, uncomplicated: Secondary | ICD-10-CM | POA: Diagnosis not present

## 2018-11-28 DIAGNOSIS — F32A Depression, unspecified: Secondary | ICD-10-CM

## 2018-11-28 DIAGNOSIS — R51 Headache: Secondary | ICD-10-CM | POA: Diagnosis present

## 2018-11-28 MED ORDER — IBUPROFEN 200 MG PO TABS
400.0000 mg | ORAL_TABLET | Freq: Once | ORAL | Status: DC
  Filled 2018-11-28: qty 2

## 2018-11-28 MED ORDER — ACETAMINOPHEN 325 MG PO TABS
650.0000 mg | ORAL_TABLET | Freq: Once | ORAL | Status: DC
  Filled 2018-11-28: qty 2

## 2018-11-28 NOTE — Progress Notes (Signed)
Subjective:        Patricia Andersen is a 53 y.o. female here for a routine exam.  Current complaints: Very distraught and heart-broken over the separation from her husband..    Personal health questionnaire:  Is patient [name], have a family history of breast and/or ovarian cancer: no Is there a family history of uterine cancer diagnosed at age < 38, gastrointestinal cancer, urinary tract cancer, family member who is a Field seismologist syndrome-associated carrier: no Is the patient overweight and hypertensive, family history of diabetes, personal history of gestational diabetes, preeclampsia or PCOS: no Is patient over 47, have PCOS,  family history of premature CHD under age 5, diabetes, smoke, have hypertension or peripheral artery disease:  no At any time, has a partner hit, kicked or otherwise hurt or frightened you?: no Over the past 2 weeks, have you felt down, depressed or hopeless?: no Over the past 2 weeks, have you felt little interest or pleasure in doing things?:no   Gynecologic History No LMP recorded. [name]. Contraception: post menopausal status Last Pap: 2017. Results were: normal Last mammogram: 2016. Results were: normal  Obstetric History OB History  Gravida Para Term Preterm AB Living  5 4 3 1 1 3   SAB TAB Ectopic Multiple Live Births  1       4    # Outcome Date GA Lbr Len/2nd Weight Sex Delivery Anes PTL Lv  5 Term 10/23/04 [redacted]w[redacted]d  6 lb 5 oz (2.863 kg) F CS-LTranv Spinal  LIV  4 Term 02/18/90 [redacted]w[redacted]d  6 lb (2.722 kg) F CS-LTranv Spinal  LIV  3 Preterm 1990 [redacted]w[redacted]d    Vag-Spont  Y ND  2 Term 04/30/86 [redacted]w[redacted]d  6 lb (2.722 kg) M Vag-Spont EPI  LIV  1 SAB             Past Medical History:  Diagnosis Date  . Hypertension     Past Surgical History:  Procedure Laterality Date  . CESAREAN SECTION     x 2  . TUBAL LIGATION    . WISDOM TOOTH EXTRACTION       Current Outpatient Medications:  .  LORazepam (ATIVAN) 1 MG tablet, Take 1 mg by  mouth every 8 (eight) hours., Disp: , Rfl:  .  sertraline (ZOLOFT) 50 MG tablet, Take 50 mg by mouth daily., Disp: , Rfl:  .  Cimetidine (ACID REDUCER PO), Take 1 tablet by mouth daily. , Disp: , Rfl:  .  ibuprofen (ADVIL,MOTRIN) 200 MG tablet, Take 200 mg by mouth every 6 (six) hours as needed for pain., Disp: , Rfl:  .  ibuprofen (ADVIL,MOTRIN) 800 MG tablet, Take 1 tablet (800 mg total) by mouth every 8 (eight) hours as needed. (Patient not taking: Reported on 05/20/2015), Disp: 30 tablet, Rfl: 5 .  pantoprazole (PROTONIX) 20 MG tablet, Take 1 tablet (20 mg total) by mouth daily., Disp: 14 tablet, Rfl: 0 No Known Allergies  Social History   Tobacco Use  . Smoking status: Current Every Day Smoker    Packs/day: 1.00    Types: Cigarettes  . Smokeless tobacco: Never Used  Substance Use Topics  . Alcohol use: No    Alcohol/week: 0.0 standard drinks    Family History  Problem Relation Age of Onset  . Cancer Father   . Heart attack Mother   . Hyperlipidemia Mother   . Hypertension Other   . Heart disease Brother   . Cancer Sister  Review of Systems  Constitutional: negative for fatigue and weight loss Respiratory: negative for cough and wheezing Cardiovascular: negative for chest pain, fatigue and palpitations Gastrointestinal: negative for abdominal pain and change in bowel habits Musculoskeletal:negative for myalgias Neurological: negative for gait problems and tremors Behavioral/Psych: negative for abusive relationship, depression Endocrine: negative for temperature intolerance    Genitourinary:negative for abnormal menstrual periods, genital lesions, hot flashes, sexual problems and vaginal discharge Integument/breast: negative for breast lump, breast tenderness, nipple discharge and skin lesion(s)    Objective:       BP (!) 146/92   Pulse 61   Ht 5\' 4"  (1.626 m)   Wt 121 lb (54.9 kg)   BMI 20.77 kg/m  General:   alert  Skin:   no rash or abnormalities   Lungs:   clear to auscultation bilaterally  Heart:   regular rate and rhythm, S1, S2 normal, no murmur, click, rub or gallop  Breasts:   normal without suspicious masses, skin or nipple changes or axillary nodes  Abdomen:  normal findings: no organomegaly, soft, non-tender and no hernia  Pelvis:  External genitalia: normal general appearance Urinary system: urethral meatus normal and bladder without fullness, nontender Vaginal: normal without tenderness, induration or masses Cervix: normal appearance Adnexa: normal bimanual exam Uterus: anteverted and non-tender, normal size   Lab Review Urine pregnancy test Labs reviewed yes Radiologic studies reviewed yes  50% of 20 min visit spent on counseling and coordination of care.   Assessment:     1. Encounter for routine gynecological examination with Papanicolaou smear of cervix Rx: - Cytology - PAP( [facility])  2. Vaginal discharge Rx: - Cervicovaginal ancillary only( [facility])  3. Screening for STD (sexually transmitted disease) Rx: - HIV Antibody (routine testing w rflx) - Hepatitis B surface antigen - RPR - Hepatitis C antibody  4. Routine adult health maintenance Rx: - TSH - Cholesterol, total - Comprehensive metabolic panel - Triglycerides - HDL cholesterol - Direct LDL  5. Screening breast examination Rx: - MM Digital Screening; Future  6. Anxiety and depression - situational.  Will need counseling and support    Plan:    Education reviewed: calcium supplements, depression evaluation, low fat, low cholesterol diet, safe sex/STD prevention, self breast exams, skin cancer screening, smoking cessation and weight bearing exercise. Mammogram ordered. Follow up in: 1 year.   No orders of the defined types were placed in this encounter.  Orders Placed This Encounter  Procedures  . MM Digital Screening    Standing Status:   Future    Standing Expiration Date:   01/27/2020    Order Specific Question:    Reason for Exam (SYMPTOM  OR DIAGNOSIS REQUIRED)    Answer:   Screening    Order Specific Question:   Is the patient pregnant?    Answer:   No    Order Specific Question:   Preferred imaging location?    Answer:   [facility]  . HIV Antibody (routine testing w rflx)  . Hepatitis B surface antigen  . RPR  . Hepatitis C antibody  . TSH  . Cholesterol, total  . Comprehensive metabolic panel  . Triglycerides  . HDL cholesterol  . Direct LDL    Shelly Bombard MD 11-28-2018

## 2018-11-28 NOTE — ED Notes (Signed)
Bed: WA24 Expected date:  Expected time:  Means of arrival:  Comments: Triage 2 

## 2018-11-28 NOTE — ED Triage Notes (Signed)
Pt stated "I was @ the Ob-Gyn and they told me my blood pressure was too high.  They told me to go see my doctor.  I checked it again after I called the Ob-Gyn office after they told me to take it while on the phone."   Readings from home 196/116, 182/118, 193/128.

## 2018-11-28 NOTE — ED Notes (Signed)
Pt stated "I've been under a lot of stress lately.  My husband of 24 years just up and left me for another woman.  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

## 2018-11-29 LAB — COMPREHENSIVE METABOLIC PANEL
ALT: 10 IU/L (ref 0–32)
AST: 20 IU/L (ref 0–40)
Albumin/Globulin Ratio: 1.5 (ref 1.2–2.2)
Albumin: 4.4 g/dL (ref 3.8–4.9)
Alkaline Phosphatase: 94 IU/L (ref 39–117)
BUN/Creatinine Ratio: 11 (ref 9–23)
BUN: 8 mg/dL (ref 6–24)
Bilirubin Total: 0.3 mg/dL (ref 0.0–1.2)
CO2: 24 mmol/L (ref 20–29)
Calcium: 10.2 mg/dL (ref 8.7–10.2)
Chloride: 100 mmol/L (ref 96–106)
Creatinine, Ser: 0.72 mg/dL (ref 0.57–1.00)
GFR calc Af Amer: 111 mL/min/{1.73_m2} (ref >59)
GFR calc non Af Amer: 97 mL/min/{1.73_m2} (ref >59)
Globulin, Total: 3 g/dL (ref 1.5–4.5)
Glucose: 100 mg/dL — ABNORMAL HIGH (ref 65–99)
Potassium: 3.8 mmol/L (ref 3.5–5.2)
Sodium: 142 mmol/L (ref 134–144)
Total Protein: 7.4 g/dL (ref 6.0–8.5)

## 2018-11-29 LAB — RPR: RPR Ser Ql: NONREACTIVE

## 2018-11-29 LAB — CERVICOVAGINAL ANCILLARY ONLY
Bacterial vaginitis: NEGATIVE
Candida vaginitis: NEGATIVE
Chlamydia: NEGATIVE
Neisseria Gonorrhea: NEGATIVE
Trichomonas: NEGATIVE

## 2018-11-29 LAB — HDL CHOLESTEROL: HDL: 50 mg/dL (ref >39)

## 2018-11-29 LAB — HEPATITIS C ANTIBODY: Hep C Virus Ab: <0.1 s/co ratio (ref 0.0–0.9)

## 2018-11-29 LAB — HEPATITIS B SURFACE ANTIGEN: Hepatitis B Surface Ag: NEGATIVE

## 2018-11-29 LAB — LDL CHOLESTEROL, DIRECT: LDL Direct: 130 mg/dL — ABNORMAL HIGH (ref 0–99)

## 2018-11-29 LAB — TSH: TSH: 0.668 u[IU]/mL (ref 0.450–4.500)

## 2018-11-29 LAB — TRIGLYCERIDES: Triglycerides: 196 mg/dL — ABNORMAL HIGH (ref 0–149)

## 2018-11-29 LAB — HIV ANTIBODY (ROUTINE TESTING W REFLEX): HIV Screen 4th Generation wRfx: NONREACTIVE

## 2018-11-29 LAB — CHOLESTEROL, TOTAL: Cholesterol, Total: 211 mg/dL — ABNORMAL HIGH (ref 100–199)

## 2018-11-29 NOTE — Discharge Instructions (Addendum)
Please continue to monitor your blood pressure twice a day and write it down to show to your primary care doctor.  Keep your appointments with your therapist.  Keep taking your Zoloft.  Recheck if you feel like you may hurt yourself or someone else. Dr Delorise Jackson will look at your blood pressures and decide if you need to be on medications for it.

## 2018-11-29 NOTE — ED Provider Notes (Signed)
[facility] DEPT Provider Note   CSN: [id number] Arrival date & time: 11/28/18  2159  Time seen 11:32 PM   History   Chief Complaint Chief Complaint  Patient presents with  . Hypertension    HPI Patricia Andersen is a 53 y.o. female.  HPI patient states 14 years ago when she had her teenage daughter who is at the bedside she had to be admitted twice for hypertension and was on medications for a few months.  She states her husband abruptly left her in December 21st and is now living with his new girlfriend.  She states she has been under a lot of stress.  She states she had a blood pressure checked a month ago and it was normal.  Today she had an appointment with her OB/GYN and her blood pressures were very high, she states this evening she checked her blood pressure because they have been high this morning and they were 196/116, 182/118.  She states she has been having a headache today behind her eyes however she states she has been crying all day.  She denies chest pain, shortness of breath, nausea, vomiting, change in her usual blurred vision, new numbness or ting of her extremities.  Patient states she had mild depression about a year ago at the death of a close friend who was like a family member.  She was treated with anxiety medications.  She had been off them however they were restarted about a month ago and she is on Zoloft.  She also started seeing a therapist last week and was at her therapist's office today.  She denies suicidal or homicidal ideation.  PCP Patient, No Pcp Per   Past Medical History:  Diagnosis Date  . Hypertension     Patient Active Problem List   Diagnosis Date Noted  . Pain aggravated by activities of daily living 05/11/2014  . Encounter for smoking cessation counseling 05/11/2014  . Lump or mass in breast 04/10/2013  . Anemia 04/10/2013    Past Surgical History:  Procedure Laterality Date  . CESAREAN SECTION     x 2    . TUBAL LIGATION    . WISDOM TOOTH EXTRACTION       OB History    Gravida  5   Para  4   Term  3   Preterm  1   AB  1   Living  3     SAB  1   TAB      Ectopic      Multiple      Live Births  4            Home Medications    Prior to Admission medications   Medication Sig Start Date End Date Taking? Authorizing Provider  ibuprofen (ADVIL,MOTRIN) 200 MG tablet Take 200 mg by mouth every 6 (six) hours as needed for pain.   Yes [provider]  LORazepam (ATIVAN) 1 MG tablet Take 1 mg by mouth every 8 (eight) hours.   Yes [provider]  sertraline (ZOLOFT) 50 MG tablet Take 50 mg by mouth daily.   Yes [provider]  ibuprofen (ADVIL,MOTRIN) 800 MG tablet Take 1 tablet (800 mg total) by mouth every 8 (eight) hours as needed. Patient not taking: Reported on 05/20/2015 05/11/14   Shelly Bombard, MD  pantoprazole (PROTONIX) 20 MG tablet Take 1 tablet (20 mg total) by mouth daily. Patient not taking: Reported on 11/28/2018 09/15/14  Al Corpus, PA-C    Family History Family History  Problem Relation Age of Onset  . Cancer Father   . Heart attack Mother   . Hyperlipidemia Mother   . Hypertension Other   . Heart disease Brother   . Cancer Sister     Social History Social History   Tobacco Use  . Smoking status: Current Every Day Smoker    Packs/day: 1.00    Types: Cigarettes  . Smokeless tobacco: Never Used  Substance Use Topics  . Alcohol use: No    Alcohol/week: 0.0 standard drinks  . 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   Review of Systems Review of Systems  All other systems reviewed and are negative.    Physical Exam Updated Vital Signs BP 126/87 (BP Location: Left Arm)   Pulse (!) 53   Temp 97.7 F (36.5 C) (Oral)   Resp 16   Ht 5\' 4"  (1.626 m)   Wt 54.4 kg   SpO2 97%   BMI 20.60 kg/m   Physical Exam Vitals signs and nursing note reviewed.   Constitutional:      General: She is not in acute distress.    Appearance: Normal appearance. She is well-developed. She is not ill-appearing or toxic-appearing.  HENT:     Head: Normocephalic and atraumatic.     Right Ear: External ear normal.     Left Ear: External ear normal.     Nose: Nose normal. No mucosal edema or rhinorrhea.     Mouth/Throat:     Dentition: No dental abscesses.     Pharynx: No uvula swelling.  Eyes:     Conjunctiva/sclera: Conjunctivae normal.     Pupils: Pupils are equal, round, and reactive to light.  Neck:     Musculoskeletal: Full passive range of motion without pain, normal range of motion and neck supple.  Cardiovascular:     Rate and Rhythm: Normal rate and regular rhythm.     Heart sounds: Normal heart sounds. No murmur. No friction rub. No gallop.   Pulmonary:     Effort: Pulmonary effort is normal. No respiratory distress.     Breath sounds: Normal breath sounds. No wheezing, rhonchi or rales.  Chest:     Chest wall: No tenderness or crepitus.  Abdominal:     General: Bowel sounds are normal. There is no distension.     Palpations: Abdomen is soft.     Tenderness: There is no abdominal tenderness. There is no guarding or rebound.  Musculoskeletal: Normal range of motion.        General: No tenderness.     Comments: Moves all extremities well.   Skin:    General: Skin is warm and dry.     Coloration: Skin is not pale.     Findings: No erythema or rash.  Neurological:     General: No focal deficit present.     Mental Status: She is alert and oriented to person, place, and time.     Cranial Nerves: No cranial nerve deficit.  Psychiatric:        Mood and Affect: Mood is depressed.        Speech: Speech normal.        Behavior: Behavior is slowed. Behavior is cooperative.        Thought Content: Thought content normal. Thought content does not include homicidal or suicidal ideation.      ED Treatments /  Results  Labs (all labs ordered  are listed, but only abnormal results are displayed) Labs Reviewed - No data to display  EKG None  Radiology No results found.  Procedures Procedures (including critical care time)  Medications Ordered in ED Medications  acetaminophen (TYLENOL) tablet 650 mg (650 mg Oral Refused 11/29/18 0007)  ibuprofen (ADVIL,MOTRIN) tablet 400 mg (400 mg Oral Refused 11/29/18 0007)     Initial Impression / Assessment and Plan / ED Course  I have reviewed the triage vital signs and the nursing notes.  Pertinent labs & imaging results that were available during my care of the patient were reviewed by me and considered in my medical decision making (see chart for details).     Patient was given Tylenol and Motrin for her headache.  Her blood pressures were not that elevated in the ED.  It was 129/91 and 132/91 before I saw her.  I had the nurses recheck her again after I saw her it was 126/87.  She did not receive any specific medications for her blood pressure in the ED.  Patient admits at that time her blood pressure was taken she was having headache from crying and was very upset.  This is most likely the reason her blood pressure was elevated.  12 midnight the secretary states patient called out that she is ready to be discharged.  Final Clinical Impressions(s) / ED Diagnoses   Final diagnoses:  Anxiety  Reactive depression  Hypertension, unspecified type    ED Discharge Orders    None     Plan discharge  Rolland Porter, MD, Barbette Or, MD 11/29/18 [phone]

## 2018-12-02 ENCOUNTER — Telehealth: Payer: Self-pay

## 2018-12-02 LAB — CYTOLOGY - PAP
Diagnosis: NEGATIVE
HPV: NOT DETECTED

## 2018-12-02 NOTE — Telephone Encounter (Signed)
TC from pt regarding results from last office visit.  Please advise.

## 2018-12-03 ENCOUNTER — Telehealth: Payer: Self-pay | Admitting: Obstetrics

## 2018-12-03 NOTE — Telephone Encounter (Signed)
Called patient to discuss labs.  All questions answered.  Patient has follow up visit with PCP today.  Labs are available to PCP per request.

## 2019-01-09 ENCOUNTER — Other Ambulatory Visit: Payer: Self-pay

## 2019-01-09 ENCOUNTER — Other Ambulatory Visit: Payer: Self-pay | Admitting: Obstetrics

## 2019-01-09 ENCOUNTER — Ambulatory Visit
Admission: RE | Admit: 2019-01-09 | Discharge: 2019-01-09 | Disposition: A | Discharge status: Home / Self Care | Payer: BLUE CROSS/BLUE SHIELD | Source: Ambulatory Visit | Attending: Obstetrics | Admitting: Obstetrics

## 2019-01-09 DIAGNOSIS — Z1239 Encounter for other screening for malignant neoplasm of breast: Secondary | ICD-10-CM

## 2020-12-27 ENCOUNTER — Other Ambulatory Visit: Payer: Self-pay | Admitting: Obstetrics

## 2020-12-27 DIAGNOSIS — Z1231 Encounter for screening mammogram for malignant neoplasm of breast: Secondary | ICD-10-CM

## 2020-12-28 ENCOUNTER — Ambulatory Visit
Admission: RE | Admit: 2020-12-28 | Discharge: 2020-12-28 | Disposition: A | Discharge status: Home / Self Care | Payer: BLUE CROSS/BLUE SHIELD | Source: Ambulatory Visit | Attending: Obstetrics | Admitting: Obstetrics

## 2020-12-28 ENCOUNTER — Other Ambulatory Visit: Payer: Self-pay

## 2020-12-28 DIAGNOSIS — Z1231 Encounter for screening mammogram for malignant neoplasm of breast: Secondary | ICD-10-CM

## 2021-01-12 ENCOUNTER — Other Ambulatory Visit (HOSPITAL_COMMUNITY)
Admission: RE | Admit: 2021-01-12 | Discharge: 2021-01-12 | Disposition: A | Discharge status: Home / Self Care | Payer: BC Managed Care – PPO | Source: Ambulatory Visit | Attending: Obstetrics | Admitting: Obstetrics

## 2021-01-12 ENCOUNTER — Ambulatory Visit (INDEPENDENT_AMBULATORY_CARE_PROVIDER_SITE_OTHER): Payer: BC Managed Care – PPO | Admitting: Obstetrics

## 2021-01-12 ENCOUNTER — Encounter: Payer: Self-pay | Admitting: Obstetrics

## 2021-01-12 VITALS — BP 155/80 | HR 73 | Ht 65.0 in | Wt 125.0 lb

## 2021-01-12 DIAGNOSIS — Z1211 Encounter for screening for malignant neoplasm of colon: Secondary | ICD-10-CM

## 2021-01-12 DIAGNOSIS — N898 Other specified noninflammatory disorders of vagina: Secondary | ICD-10-CM

## 2021-01-12 DIAGNOSIS — Z01419 Encounter for gynecological examination (general) (routine) without abnormal findings: Secondary | ICD-10-CM | POA: Insufficient documentation

## 2021-01-12 DIAGNOSIS — R87810 Cervical high risk human papillomavirus (HPV) DNA test positive: Secondary | ICD-10-CM | POA: Insufficient documentation

## 2021-01-12 DIAGNOSIS — Z113 Encounter for screening for infections with a predominantly sexual mode of transmission: Secondary | ICD-10-CM | POA: Diagnosis not present

## 2021-01-12 DIAGNOSIS — N760 Acute vaginitis: Secondary | ICD-10-CM | POA: Insufficient documentation

## 2021-01-12 DIAGNOSIS — B9689 Other specified bacterial agents as the cause of diseases classified elsewhere: Secondary | ICD-10-CM | POA: Insufficient documentation

## 2021-01-12 DIAGNOSIS — M25551 Pain in right hip: Secondary | ICD-10-CM

## 2021-01-12 DIAGNOSIS — Z1151 Encounter for screening for human papillomavirus (HPV): Secondary | ICD-10-CM | POA: Insufficient documentation

## 2021-01-12 DIAGNOSIS — Z720 Tobacco use: Secondary | ICD-10-CM

## 2021-01-12 DIAGNOSIS — E2839 Other primary ovarian failure: Secondary | ICD-10-CM

## 2021-01-12 MED ORDER — CYCLOBENZAPRINE HCL 5 MG PO TABS: 5.0000 mg | ORAL_TABLET | Freq: Three times a day (TID) | ORAL | 1 refills | Status: DC | PRN

## 2021-01-12 MED ORDER — DICLOFENAC SODIUM 75 MG PO TBEC: 75.0000 mg | DELAYED_RELEASE_TABLET | Freq: Two times a day (BID) | ORAL | 2 refills | Status: DC

## 2021-01-12 NOTE — Progress Notes (Signed)
Patient presents for Annual Exam.  Last pap:11/28/2018 WNL  Contraception:BTL Colonoscopy: Never  Mammogram: 01/09/2019 Scheduled 02/21/2021 Family Hx of Breast Cancer: sister  STD Screening: Desires   CC: None

## 2021-01-12 NOTE — Progress Notes (Signed)
Subjective:        Patricia Andersen is a 55 y.o. female here for a routine exam.  Current complaints: Vaginal discharge.  Right hip pain that radiates down right leg.  Personal health questionnaire:  Is patient Patricia Andersen, have a family history of breast and/or ovarian cancer: no Is there a family history of uterine cancer diagnosed at age < 18, gastrointestinal cancer, urinary tract cancer, family member who is a Field seismologist syndrome-associated carrier: no Is the patient overweight and hypertensive, family history of diabetes, personal history of gestational diabetes, preeclampsia or PCOS: no Is patient over 34, have PCOS,  family history of premature CHD under age 44, diabetes, smoke, have hypertension or peripheral artery disease:  no At any time, has a partner hit, kicked or otherwise hurt or frightened you?: no Over the past 2 weeks, have you felt down, depressed or hopeless?: no Over the past 2 weeks, have you felt little interest or pleasure in doing things?:no   Gynecologic History No LMP recorded. [name]. Contraception: tubal ligation Last Pap: 2020. Results were: normal Last mammogram: 2022. Results were: normal  Obstetric History OB History  Gravida Para Term Preterm AB Living  5 4 3 1 1 3   SAB IAB Ectopic Multiple Live Births  1       4    # Outcome Date GA Lbr Len/2nd Weight Sex Delivery Anes PTL Lv  5 Term 10/23/04 [redacted]w[redacted]d  6 lb 5 oz (2.863 kg) F CS-LTranv Spinal  LIV  4 Term 02/18/90 [redacted]w[redacted]d  6 lb (2.722 kg) F CS-LTranv Spinal  LIV  3 Preterm 1990 [redacted]w[redacted]d    Vag-Spont  Y ND  2 Term 04/30/86 [redacted]w[redacted]d  6 lb (2.722 kg) M Vag-Spont EPI  LIV  1 SAB             Past Medical History:  Diagnosis Date  . Hypertension     Past Surgical History:  Procedure Laterality Date  . BREAST CYST ASPIRATION Right 2016  . CESAREAN SECTION     x 2  . TUBAL LIGATION    . WISDOM TOOTH EXTRACTION       Current Outpatient Medications:  .  cyclobenzaprine  (FLEXERIL) 5 MG tablet, Take 1 tablet (5 mg total) by mouth 3 (three) times daily as needed for muscle spasms., Disp: 30 tablet, Rfl: 1 .  diclofenac (VOLTAREN) 75 MG EC tablet, Take 1 tablet (75 mg total) by mouth 2 (two) times daily with a meal., Disp: 60 tablet, Rfl: 2 .  LORazepam (ATIVAN) 1 MG tablet, Take 1 mg by mouth every 8 (eight) hours., Disp: , Rfl:  .  ibuprofen (ADVIL,MOTRIN) 200 MG tablet, Take 200 mg by mouth every 6 (six) hours as needed for pain. (Patient not taking: Reported on 01/12/2021), Disp: , Rfl:  .  ibuprofen (ADVIL,MOTRIN) 800 MG tablet, Take 1 tablet (800 mg total) by mouth every 8 (eight) hours as needed. (Patient not taking: No sig reported), Disp: 30 tablet, Rfl: 5 .  pantoprazole (PROTONIX) 20 MG tablet, Take 1 tablet (20 mg total) by mouth daily. (Patient not taking: No sig reported), Disp: 14 tablet, Rfl: 0 .  sertraline (ZOLOFT) 50 MG tablet, Take 50 mg by mouth daily. (Patient not taking: Reported on 01/12/2021), Disp: , Rfl:  No Known Allergies  Social History   Tobacco Use  . Smoking status: Current Every Day Smoker    Packs/day: 1.00    Types: Cigarettes  . Smokeless tobacco: Never Used  Substance Use Topics  . Alcohol use: No    Alcohol/week: 0.0 standard drinks    Family History  Problem Relation Age of Onset  . Cancer Father   . Heart attack Mother   . Hyperlipidemia Mother   . Hypertension Other   . Heart disease Brother   . Cancer Sister       Review of Systems  Constitutional: negative for fatigue and weight loss Respiratory: negative for cough and wheezing Cardiovascular: negative for chest pain, fatigue and palpitations Gastrointestinal: negative for abdominal pain and change in bowel habits Musculoskeletal:negative for myalgias Neurological: negative for gait problems and tremors Behavioral/Psych: negative for abusive relationship, depression Endocrine: negative for temperature intolerance    Genitourinary:negative for abnormal  menstrual periods, genital lesions, hot flashes, sexual problems.  Positive for vaginal discharge Integument/breast: negative for breast lump, breast tenderness, nipple discharge and skin lesion(s)    Objective:       BP (!) 155/80   Pulse 73   Ht 5\' 5"  (1.651 m)   Wt 125 lb (56.7 kg)   BMI 20.80 kg/m  General:   alert and no distress  Skin:   no rash or abnormalities  Lungs:   clear to auscultation bilaterally  Heart:   regular rate and rhythm, S1, S2 normal, no murmur, click, rub or gallop  Breasts:   normal without suspicious masses, skin or nipple changes or axillary nodes  Abdomen:  normal findings: no organomegaly, soft, non-tender and no hernia  Pelvis:  External genitalia: normal general appearance Urinary system: urethral meatus normal and bladder without fullness, nontender Vaginal: normal without tenderness, induration or masses Cervix: normal appearance Adnexa: normal bimanual exam Uterus: anteverted and non-tender, normal size   Lab Review Urine pregnancy test Labs reviewed yes Radiologic studies reviewed yes  50% of 20 min visit spent on counseling and coordination of care.   Assessment:     1. Encounter for gynecological examination with Papanicolaou smear of cervix Rx: - Cytology - PAP( [facility])  2. Vaginal discharge Rx: - Cervicovaginal ancillary only( [facility])  3. Screening for STD (sexually transmitted disease) Rx: - RPR+HBsAg+HCVAb+...  4. Acute pain of right hip Rx: - diclofenac (VOLTAREN) 75 MG EC tablet; Take 1 tablet (75 mg total) by mouth 2 (two) times daily with a meal.  Dispense: 60 tablet; Refill: 2 - cyclobenzaprine (FLEXERIL) 5 MG tablet; Take 1 tablet (5 mg total) by mouth 3 (three) times daily as needed for muscle spasms.  Dispense: 30 tablet; Refill: 1  5. Screen for colon cancer Rx: - Ambulatory referral to Gastroenterology  6. Tobacco abuse - cessation recommended with the aid of medication and behavioral  modification  7. Hypoestrogenism Rx: - DG BONE DENSITY (DXA); Future    Plan:    Education reviewed: calcium supplements, depression evaluation, low fat, low cholesterol diet, safe sex/STD prevention, self breast exams, skin cancer screening, smoking cessation and weight bearing exercise. Follow up in: 1 year.   Meds ordered this encounter  Medications  . diclofenac (VOLTAREN) 75 MG EC tablet    Sig: Take 1 tablet (75 mg total) by mouth 2 (two) times daily with a meal.    Dispense:  60 tablet    Refill:  2  . cyclobenzaprine (FLEXERIL) 5 MG tablet    Sig: Take 1 tablet (5 mg total) by mouth 3 (three) times daily as needed for muscle spasms.    Dispense:  30 tablet    Refill:  1   Orders Placed  This Encounter  Procedures  . DG BONE DENSITY (DXA)    Standing Status:   Future    Standing Expiration Date:   01/12/2022    Order Specific Question:   Reason for Exam (SYMPTOM  OR DIAGNOSIS REQUIRED)    Answer:   Hypoestrogenemia    Order Specific Question:   Is the patient pregnant?    Answer:   No    Order Specific Question:   Preferred imaging location?    Answer:   [facility]  . RPR+HBsAg+HCVAb+...  . Ambulatory referral to Gastroenterology    Referral Priority:   Routine    Referral Type:   Consultation    Referral Reason:   Specialty Services Required    Number of Visits Requested:   1    Shelly Bombard, MD 01/12/2021 11:36 AM

## 2021-01-13 ENCOUNTER — Other Ambulatory Visit: Payer: Self-pay

## 2021-01-13 LAB — CERVICOVAGINAL ANCILLARY ONLY
Bacterial Vaginitis (gardnerella): POSITIVE — AB
Candida Glabrata: NEGATIVE
Candida Vaginitis: NEGATIVE
Chlamydia: NEGATIVE
Comment: NEGATIVE
Comment: NEGATIVE
Comment: NEGATIVE
Comment: NEGATIVE
Comment: NEGATIVE
Comment: NORMAL
Neisseria Gonorrhea: NEGATIVE
Trichomonas: NEGATIVE

## 2021-01-13 MED ORDER — METRONIDAZOLE 500 MG PO TABS: 500.0000 mg | ORAL_TABLET | Freq: Two times a day (BID) | ORAL | 0 refills | Status: DC

## 2021-01-13 NOTE — Telephone Encounter (Signed)
Patient tested positive for BV she would not to be treated. Flagyl sent into patient pharmacy.

## 2021-01-14 ENCOUNTER — Other Ambulatory Visit: Payer: Self-pay | Admitting: Obstetrics

## 2021-01-17 LAB — CYTOLOGY - PAP
Comment: NEGATIVE
Comment: NEGATIVE
Diagnosis: NEGATIVE
Diagnosis: REACTIVE
HPV 16: NEGATIVE
HPV 18 / 45: NEGATIVE
High risk HPV: POSITIVE — AB

## 2021-01-24 ENCOUNTER — Encounter: Payer: Self-pay | Admitting: Gastroenterology

## 2021-02-21 ENCOUNTER — Other Ambulatory Visit: Payer: Self-pay

## 2021-02-21 ENCOUNTER — Ambulatory Visit
Admission: RE | Admit: 2021-02-21 | Discharge: 2021-02-21 | Disposition: A | Discharge status: Home / Self Care | Payer: BC Managed Care – PPO | Source: Ambulatory Visit | Attending: Obstetrics | Admitting: Obstetrics

## 2021-03-16 ENCOUNTER — Ambulatory Visit (AMBULATORY_SURGERY_CENTER): Payer: Self-pay | Admitting: *Deleted

## 2021-03-16 ENCOUNTER — Other Ambulatory Visit: Payer: Self-pay

## 2021-03-16 VITALS — Ht 65.0 in | Wt 128.0 lb

## 2021-03-16 DIAGNOSIS — Z1211 Encounter for screening for malignant neoplasm of colon: Secondary | ICD-10-CM

## 2021-03-16 MED ORDER — PEG 3350-KCL-NA BICARB-NACL 420 G PO SOLR: 4000.0000 mL | Freq: Once | ORAL | 0 refills | Status: AC

## 2021-03-16 NOTE — Progress Notes (Signed)

## 2021-03-25 ENCOUNTER — Encounter: Payer: Self-pay | Admitting: Gastroenterology

## 2021-04-05 ENCOUNTER — Encounter: Payer: Self-pay | Admitting: Gastroenterology

## 2021-04-05 ENCOUNTER — Other Ambulatory Visit: Payer: Self-pay

## 2021-04-05 ENCOUNTER — Ambulatory Visit (AMBULATORY_SURGERY_CENTER): Payer: BC Managed Care – PPO | Admitting: Gastroenterology

## 2021-04-05 VITALS — BP 121/58 | HR 58 | Temp 97.0°F | Resp 13 | Ht 65.0 in | Wt 128.0 lb

## 2021-04-05 DIAGNOSIS — D127 Benign neoplasm of rectosigmoid junction: Secondary | ICD-10-CM | POA: Diagnosis not present

## 2021-04-05 DIAGNOSIS — Z1211 Encounter for screening for malignant neoplasm of colon: Secondary | ICD-10-CM | POA: Diagnosis not present

## 2021-04-05 DIAGNOSIS — D123 Benign neoplasm of transverse colon: Secondary | ICD-10-CM

## 2021-04-05 MED ORDER — SODIUM CHLORIDE 0.9 % IV SOLN: 500.0000 mL | Freq: Once | INTRAVENOUS | Status: DC

## 2021-04-05 NOTE — Progress Notes (Signed)
Called to room to assist during endoscopic procedure.  Patient ID and intended procedure confirmed with present staff. Received instructions for my participation in the procedure from the performing physician.  

## 2021-04-05 NOTE — Op Note (Signed)
[facility] Patient Name: Patricia Andersen Procedure Date: 04/05/2021 7:39 AM MRN: [id number] Endoscopist: Remo Lipps P. Havery Moros , MD Age: 55 Referring MD:  Date of Birth: [birth year] Gender: Female Account #: [id number] Procedure:                Colonoscopy Indications:              Screening for colorectal malignant neoplasm, This                            is the patient's first colonoscopy Medicines:                Monitored Anesthesia Care Procedure:                Pre-Anesthesia Assessment:                           - Prior to the procedure, a History and Physical                            was performed, and patient medications and                            allergies were reviewed. The patient's tolerance of                            previous anesthesia was also reviewed. The risks                            and benefits of the procedure and the sedation                            options and risks were discussed with the patient.                            All questions were answered, and informed consent                            was obtained. Prior Anticoagulants: The patient has                            taken no previous anticoagulant or antiplatelet                            agents. ASA Grade Assessment: II - A patient with                            mild systemic disease. After reviewing the risks                            and benefits, the patient was deemed in                            satisfactory condition to undergo the procedure.  After obtaining informed consent, the colonoscope                            was passed under direct vision. Throughout the                            procedure, the patient's blood pressure, pulse, and                            oxygen saturations were monitored continuously. The                            Olympus PFC-[id number] (#[id number]) Colonoscope was                            introduced through the  anus and advanced to the the                            cecum, identified by appendiceal orifice and                            ileocecal valve. The colonoscopy was performed                            without difficulty. The patient tolerated the                            procedure well. The quality of the bowel                            preparation was good. The ileocecal valve,                            appendiceal orifice, and rectum were photographed. Scope In: 8:01:49 AM Scope Out: 8:17:45 AM Scope Withdrawal Time: 0 hours 11 minutes 38 seconds  Total Procedure Duration: 0 hours 15 minutes 56 seconds  Findings:                 The perianal and digital rectal examinations were                            normal.                           A 3 to 4 mm polyp was found in the transverse                            colon. The polyp was sessile. The polyp was removed                            with a cold snare. Resection and retrieval were                            complete.  A 3 mm polyp was found in the recto-sigmoid colon.                            The polyp was flat. The polyp was removed with a                            cold snare. Resection and retrieval were complete.                           Internal hemorrhoids were found during retroflexion.                           The exam was otherwise without abnormality. Complications:            No immediate complications. Estimated blood loss:                            Minimal. Estimated Blood Loss:     Estimated blood loss was minimal. Impression:               - One 3 to 4 mm polyp in the transverse colon,                            removed with a cold snare. Resected and retrieved.                           - One 3 mm polyp at the recto-sigmoid colon,                            removed with a cold snare. Resected and retrieved.                           - Internal hemorrhoids.                            - The examination was otherwise normal. Recommendation:           - Patient has a contact number available for                            emergencies. The signs and symptoms of potential                            delayed complications were discussed with the                            patient. Return to normal activities tomorrow.                            Written discharge instructions were provided to the                            patient.                           -  Resume previous diet.                           - Continue present medications.                           - Await pathology results. Remo Lipps P. Patricia Oleson, MD 04/05/2021 8:21:35 AM This report has been signed electronically.

## 2021-04-05 NOTE — Patient Instructions (Signed)
Read all of the handouts given to  you by your recovery room nurse.  YOU HAD AN ENDOSCOPIC PROCEDURE TODAY AT [facility]:   Refer to the procedure report that was given to you for any specific questions about what was found during the examination.  If the procedure report does not answer your questions, please call your gastroenterologist to clarify.  If you requested that your care partner not be given the details of your procedure findings, then the procedure report has been included in a sealed envelope for you to review at your convenience later.  YOU SHOULD EXPECT: Some feelings of bloating in the abdomen. Passage of more gas than usual.  Walking can help get rid of the air that was put into your GI tract during the procedure and reduce the bloating. If you had a lower endoscopy (such as a colonoscopy or flexible sigmoidoscopy) you may notice spotting of blood in your stool or on the toilet paper. If you underwent a bowel prep for your procedure, you may not have a normal bowel movement for a few days.  Please Note:  You might notice some irritation and congestion in your nose or some drainage.  This is from the oxygen used during your procedure.  There is no need for concern and it should clear up in a day or so.  SYMPTOMS TO REPORT IMMEDIATELY:   Following lower endoscopy (colonoscopy or flexible sigmoidoscopy):  Excessive amounts of blood in the stool  Significant tenderness or worsening of abdominal pains  Swelling of the abdomen that is new, acute  Fever of 100F or higher   For urgent or emergent issues, a gastroenterologist can be reached at any hour by calling [phone]. Do not use MyChart messaging for urgent concerns.    DIET:  We do recommend a small meal at first, but then you may proceed to your regular diet.  Drink plenty of fluids but you should avoid alcoholic beverages for 24 hours.  Try to eat a high fiber diet, and drink plenty of  water.  ACTIVITY:  You should plan to take it easy for the rest of today and you should NOT DRIVE or use heavy machinery until tomorrow (because of the sedation medicines used during the test).    FOLLOW UP: Our staff will call the number listed on your records 48-72 hours following your procedure to check on you and address any questions or concerns that you may have regarding the information given to you following your procedure. If we do not reach you, we will leave a message.  We will attempt to reach you two times.  During this call, we will ask if you have developed any symptoms of COVID 19. If you develop any symptoms (ie: fever, flu-like symptoms, shortness of breath, cough etc.) before then, please call [phone].  If you test positive for Covid 19 in the 2 weeks post procedure, please call and report this information to [place].    If any biopsies were taken you will be contacted by phone or by letter within the next 1-3 weeks.  Please call us at [phone] if you have not heard about the biopsies in 3 weeks.    SIGNATURES/CONFIDENTIALITY: You and/or your care partner have signed paperwork which will be entered into your electronic medical record.  These signatures attest to the fact that that the information above on your After Visit Summary has been reviewed and is understood.  Full responsibility of the confidentiality  of this discharge information lies with you and/or your care-partner.

## 2021-04-05 NOTE — Progress Notes (Signed)
Vitals by CW, Pt's states no medical or surgical changes since previsit or office visit.

## 2021-04-05 NOTE — Progress Notes (Signed)
To PACU, VSS. Report to [email or web address] 

## 2021-04-07 ENCOUNTER — Telehealth: Payer: Self-pay | Admitting: *Deleted

## 2021-04-07 NOTE — Telephone Encounter (Signed)
  Follow up Call-  Call back number 04/05/2021  Post procedure Call Back phone  # [phone]  Permission to leave phone message Yes  Some recent data might be hidden     Patient questions:  Do you have a fever, pain , or abdominal swelling? No. Pain Score  0 *  Have you tolerated food without any problems? Yes.    Have you been able to return to your normal activities? Yes.    Do you have any questions about your discharge instructions: Diet   No. Medications  No. Follow up visit  No.  Do you have questions or concerns about your Care? No.  Actions: * If pain score is 4 or above: No action needed, pain <4.Have you developed a fever since your procedure? no  2.   Have you had an respiratory symptoms (SOB or cough) since your procedure? no  3.   Have you tested positive for COVID 19 since your procedure no  4.   Have you had any family members/close contacts diagnosed with the COVID 19 since your procedure?  no   If yes to any of these questions please route to Joylene John, RN and Joella Prince, RN

## 2021-05-15 ENCOUNTER — Other Ambulatory Visit: Payer: Self-pay | Admitting: Obstetrics

## 2021-05-15 DIAGNOSIS — M25551 Pain in right hip: Secondary | ICD-10-CM

## 2021-05-16 NOTE — Telephone Encounter (Signed)
Please review for refill.  

## 2021-06-23 ENCOUNTER — Other Ambulatory Visit: Payer: BC Managed Care – PPO

## 2022-02-22 ENCOUNTER — Encounter: Payer: Self-pay | Admitting: Obstetrics

## 2022-02-22 ENCOUNTER — Other Ambulatory Visit (HOSPITAL_COMMUNITY)
Admission: RE | Admit: 2022-02-22 | Discharge: 2022-02-22 | Disposition: A | Discharge status: Home / Self Care | Payer: BC Managed Care – PPO | Source: Ambulatory Visit | Attending: Obstetrics | Admitting: Obstetrics

## 2022-02-22 ENCOUNTER — Ambulatory Visit (INDEPENDENT_AMBULATORY_CARE_PROVIDER_SITE_OTHER): Payer: BC Managed Care – PPO | Admitting: Obstetrics

## 2022-02-22 VITALS — BP 193/95 | HR 64 | Ht 65.0 in | Wt 134.6 lb

## 2022-02-22 DIAGNOSIS — Z01419 Encounter for gynecological examination (general) (routine) without abnormal findings: Secondary | ICD-10-CM | POA: Insufficient documentation

## 2022-02-22 DIAGNOSIS — Z1239 Encounter for other screening for malignant neoplasm of breast: Secondary | ICD-10-CM | POA: Diagnosis not present

## 2022-02-22 DIAGNOSIS — Z720 Tobacco use: Secondary | ICD-10-CM

## 2022-02-22 DIAGNOSIS — Z780 Asymptomatic menopausal state: Secondary | ICD-10-CM

## 2022-02-22 NOTE — Progress Notes (Signed)
? ?Subjective: ? ? ?  ?  ? Patricia Andersen is a 56 y.o. female here for a routine exam.  Current complaints: None.   ? ?Personal health questionnaire:  ?Is patient [name], have a family history of breast and/or ovarian cancer: yes ?Is there a family history of uterine cancer diagnosed at age < 54, gastrointestinal cancer, urinary tract cancer, family member who is a Field seismologist syndrome-associated carrier: no ?Is the patient overweight and hypertensive, family history of diabetes, personal history of gestational diabetes, preeclampsia or PCOS: no ?Is patient over 40, have PCOS,  family history of premature CHD under age 77, diabetes, smoke, have hypertension or peripheral artery disease:  no ?At any time, has a partner hit, kicked or otherwise hurt or frightened you?: no ?Over the past 2 weeks, have you felt down, depressed or hopeless?: no ?Over the past 2 weeks, have you felt little interest or pleasure in doing things?:no ? ? ?Gynecologic History ?No LMP recorded. [name]. ?Contraception: post menopausal status ?Last Pap: 01-12-21. Results were: NILM with positive HRHPV ?Last mammogram: 2022. Results were: normal ? ?Obstetric History ?OB History  ?Gravida Para Term Preterm AB Living  ?'5 4 3 1 1 3  '$ ?SAB IAB Ectopic Multiple Live Births  ?1       4  ?  ?# Outcome Date GA Lbr Len/2nd Weight Sex Delivery Anes PTL Lv  ?5 Term 10/23/04 [redacted]w[redacted]d 6 lb 5 oz (2.863 kg) F CS-LTranv Spinal  LIV  ?4 Term 02/18/90 383w0d6 lb (2.722 kg) F CS-LTranv Spinal  LIV  ?3 Preterm 1990 [id number] Vag-Spont  Y ND  ?2 Term 04/30/86 38w12w0dlb (2.722 kg) M Vag-Spont EPI  LIV  ?1 SAB           ? ? ?Past Medical History:  ?Diagnosis Date  ? Hyperlipidemia   ? Hypertension   ?  ?Past Surgical History:  ?Procedure Laterality Date  ? BREAST CYST ASPIRATION Right 2016  ? CESAREAN SECTION    ? x 2  ? TUBAL LIGATION    ? WISDOM TOOTH EXTRACTION    ?  ? ?Current Outpatient Medications:  ?  LORazepam (ATIVAN) 1 MG tablet, Take 1  mg by mouth every 8 (eight) hours., Disp: , Rfl:  ?  rosuvastatin (CRESTOR) 10 MG tablet, Take 10 mg by mouth daily., Disp: , Rfl:  ?  diclofenac (VOLTAREN) 75 MG EC tablet, TAKE 1 TABLET (75 MG TOTAL) BY MOUTH 2 (TWO) TIMES DAILY WITH A MEAL. (Patient not taking: Reported on 02/22/2022), Disp: 60 tablet, Rfl: 2 ?  ibuprofen (ADVIL,MOTRIN) 200 MG tablet, Take 200 mg by mouth every 6 (six) hours as needed for pain. (Patient not taking: Reported on 02/22/2022), Disp: , Rfl:  ?  ibuprofen (ADVIL,MOTRIN) 800 MG tablet, Take 1 tablet (800 mg total) by mouth every 8 (eight) hours as needed. (Patient not taking: Reported on 05/20/2015), Disp: 30 tablet, Rfl: 5 ?No Known Allergies  ?Social History  ? ?Tobacco Use  ? Smoking status: Every Day  ?  Packs/day: 1.00  ?  Types: Cigarettes  ? Smokeless tobacco: Never  ?Substance Use Topics  ? Alcohol use: No  ?  Alcohol/week: 0.0 standard drinks  ?  ?Family History  ?Problem Relation Age of Onset  ? Cancer Father   ? Heart attack Mother   ? Hyperlipidemia Mother   ? Hypertension Other   ? Heart disease Brother   ? Cancer Sister   ?  Colon polyps Neg Hx   ? Colon cancer Neg Hx   ? Esophageal cancer Neg Hx   ? Rectal cancer Neg Hx   ? Stomach cancer Neg Hx   ?  ? ? ?Review of Systems ? ?Constitutional: negative for fatigue and weight loss ?Respiratory: negative for cough and wheezing ?Cardiovascular: negative for chest pain, fatigue and palpitations ?Gastrointestinal: negative for abdominal pain and change in bowel habits ?Musculoskeletal:negative for myalgias ?Neurological: negative for gait problems and tremors ?Behavioral/Psych: negative for abusive relationship, depression ?Endocrine: negative for temperature intolerance    ?Genitourinary:negative for abnormal menstrual periods, genital lesions, hot flashes, sexual problems and vaginal discharge ?Integument/breast: negative for breast lump, breast tenderness, nipple discharge and skin lesion(s) ? ?  ?Objective:  ? ?    ?BP (!)  193/95   Pulse 64   Ht '5\' 5"'$  (1.651 m)   Wt 134 lb 9.6 oz (61.1 kg)   BMI 22.40 kg/m?  ?General:   Alert and no distress  ?Skin:   no rash or abnormalities  ?Lungs:   clear to auscultation bilaterally  ?Heart:   regular rate and rhythm, S1, S2 normal, no murmur, click, rub or gallop  ?Breasts:   normal without suspicious masses, skin or nipple changes or axillary nodes  ?Abdomen:  normal findings: no organomegaly, soft, non-tender and no hernia  ?Pelvis:  External genitalia: normal general appearance ?Urinary system: urethral meatus normal and bladder without fullness, nontender ?Vaginal: normal without tenderness, induration or masses ?Cervix: normal appearance ?Adnexa: normal bimanual exam ?Uterus: anteverted and non-tender, normal size  ? ?Lab Review ?Urine pregnancy test ?Labs reviewed yes ?Radiologic studies reviewed yes ? ?I have spent a total of 20 minutes of face-to-face time, excluding clinical staff time, reviewing notes and preparing to see patient, ordering tests and/or medications, and counseling the patient.  ? ?Assessment:  ? ?  1. Encounter for gynecological examination with Papanicolaou smear of cervix ?Rx: ?- Cytology - PAP ? ?2. Postmenopause ?- doing well ? ?3. Screening breast examination ?Rx: ?- MM 3D SCREEN BREAST BILATERAL; Future ? ?4. Tobacco abuse ?- cessation recommended ? ?  ?Plan:  ? ? Education reviewed: calcium supplements, depression evaluation, low fat, low cholesterol diet, safe sex/STD prevention, self breast exams, smoking cessation, and weight bearing exercise. ?Contraception: post menopausal status. ?Follow up in: 1 year.  ? ? ?Orders Placed This Encounter  ?Procedures  ? MM 3D SCREEN BREAST BILATERAL  ?  Standing Status:   Future  ?  Standing Expiration Date:   02/23/2023  ?  Order Specific Question:   Reason for Exam (SYMPTOM  OR DIAGNOSIS REQUIRED)  ?  Answer:   Needs routine screening  ?  Order Specific Question:   Is the patient pregnant?  ?  Answer:   No  ?  Order  Specific Question:   Preferred imaging location?  ?  Answer:   [facility]  ? ? Shelly Bombard, MD ?02/22/2022 9:29 AM  ?

## 2022-02-24 LAB — CYTOLOGY - PAP
Adequacy: ABSENT
Comment: NEGATIVE
Diagnosis: NEGATIVE
High risk HPV: NEGATIVE

## 2022-03-09 ENCOUNTER — Ambulatory Visit: Payer: BC Managed Care – PPO

## 2022-03-29 ENCOUNTER — Ambulatory Visit
Admission: RE | Admit: 2022-03-29 | Discharge: 2022-03-29 | Disposition: A | Discharge status: Home / Self Care | Payer: BC Managed Care – PPO | Source: Ambulatory Visit | Attending: Obstetrics | Admitting: Obstetrics

## 2022-03-29 DIAGNOSIS — Z1239 Encounter for other screening for malignant neoplasm of breast: Secondary | ICD-10-CM

## 2022-07-05 ENCOUNTER — Other Ambulatory Visit: Payer: Self-pay | Admitting: Obstetrics

## 2022-07-05 DIAGNOSIS — M25551 Pain in right hip: Secondary | ICD-10-CM

## 2023-01-03 IMAGING — MG MM DIGITAL SCREENING BILAT W/ TOMO AND CAD
8 series · 9 of 24 positions shown · non-contrast
Comparison: Previous exam(s).

CLINICAL DATA: Screening.

EXAM:
DIGITAL SCREENING BILATERAL MAMMOGRAM WITH TOMOSYNTHESIS AND CAD
TECHNIQUE: Bilateral screening digital craniocaudal and mediolateral oblique
mammograms were obtained. Bilateral screening digital breast
tomosynthesis was performed. The images were evaluated with
computer-aided detection.

[L CC synth-2D]
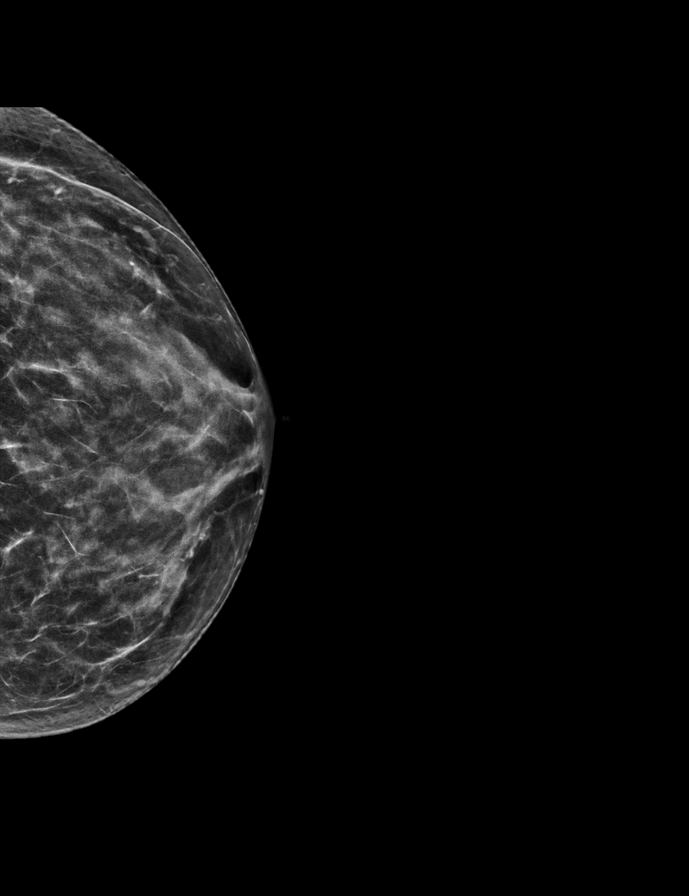

[R MLO synth-2D]
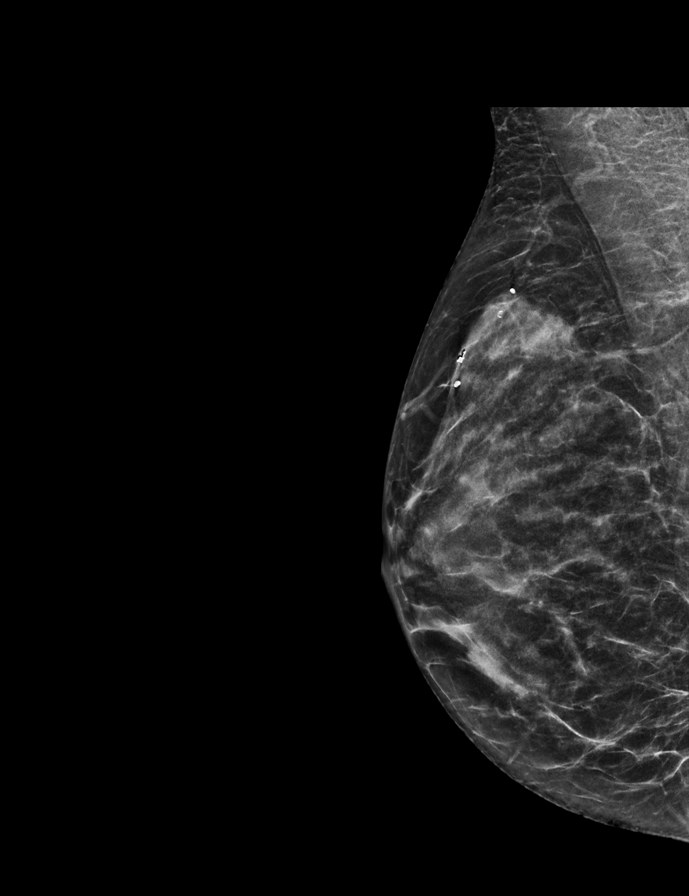

[L MLO synth-2D]
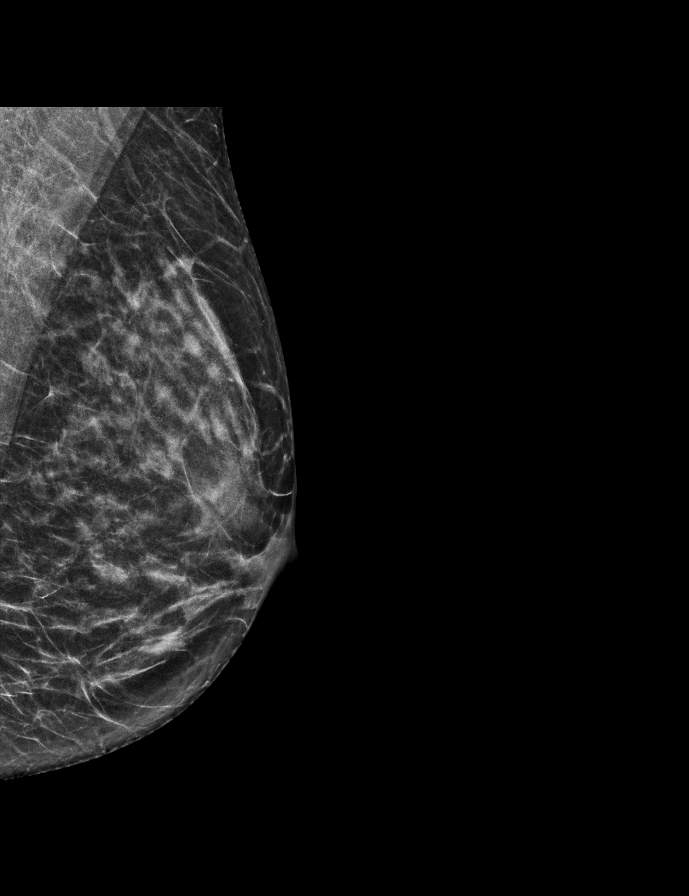

[R CC synth-2D]
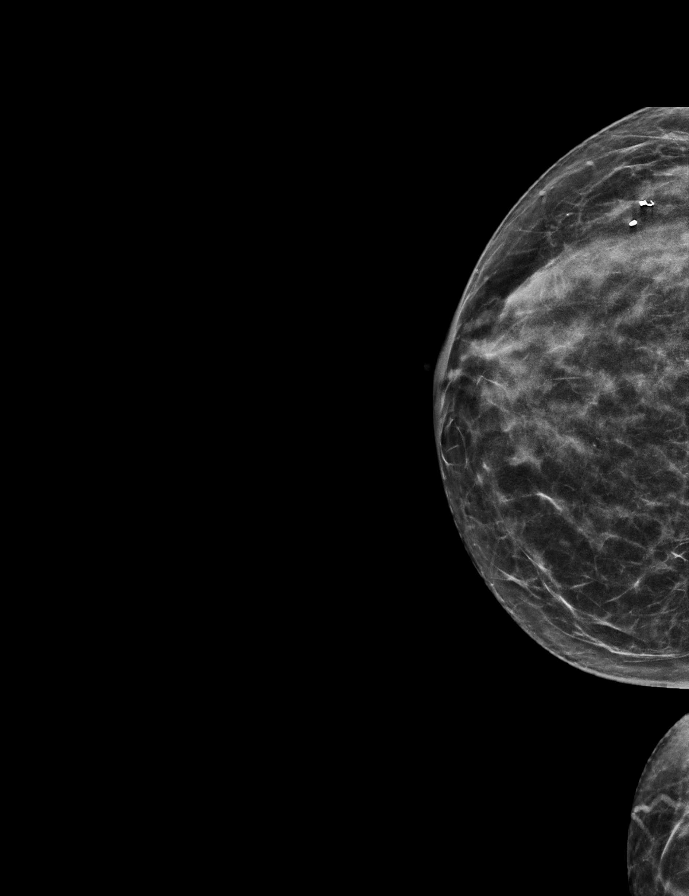

[R CC tomo · 2 of 52 frames shown]
[frame 17/52]
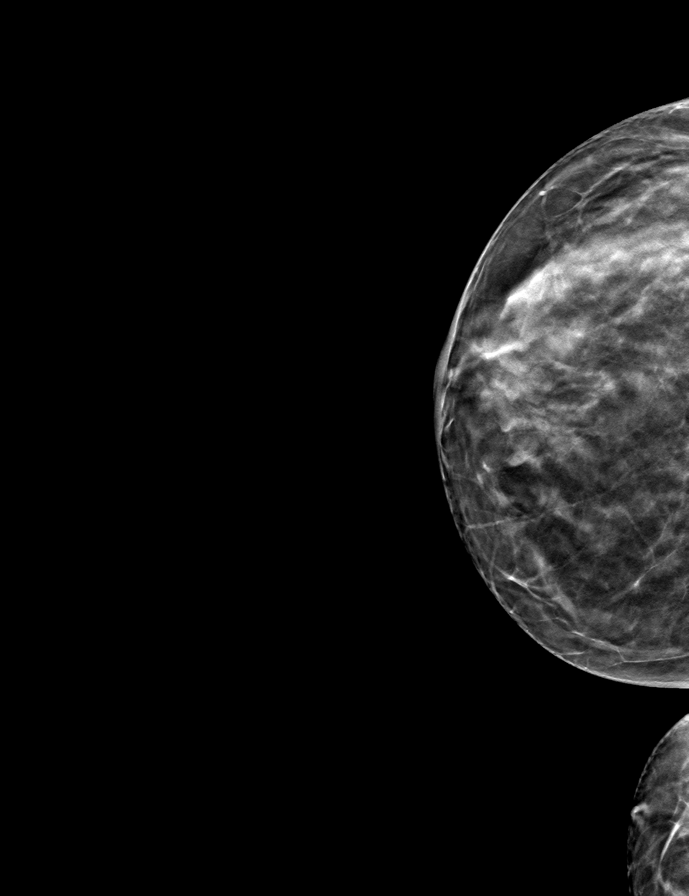
[frame 27/52]
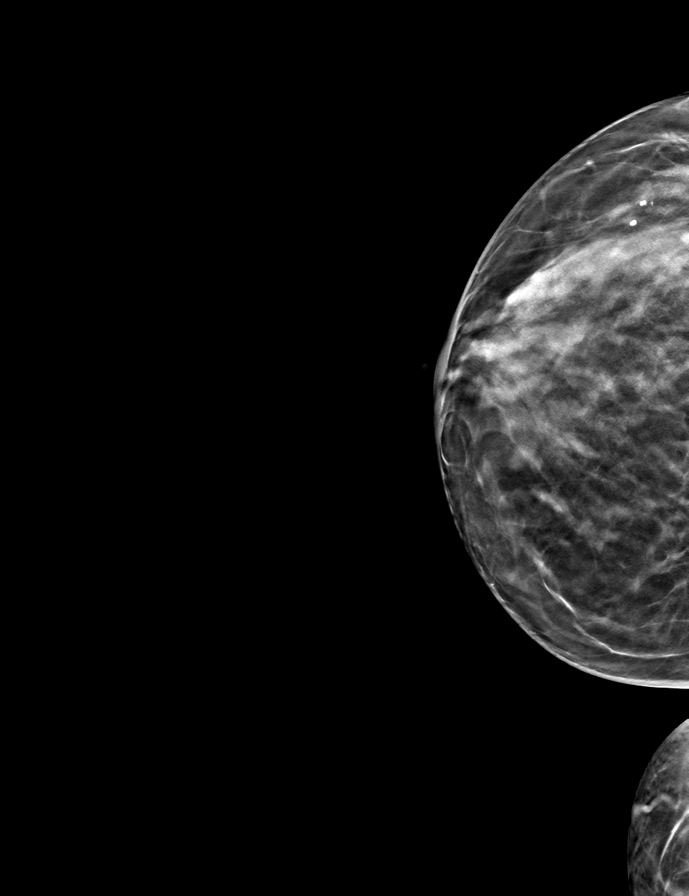

[L CC tomo · tomo slice 27/54.0]
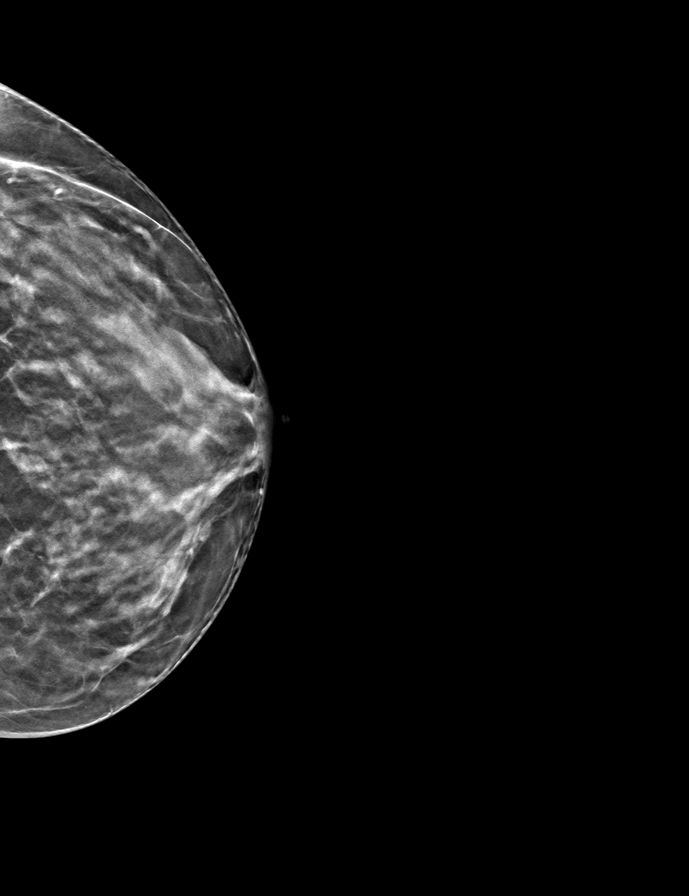

[R MLO tomo · tomo slice 27/52.0]
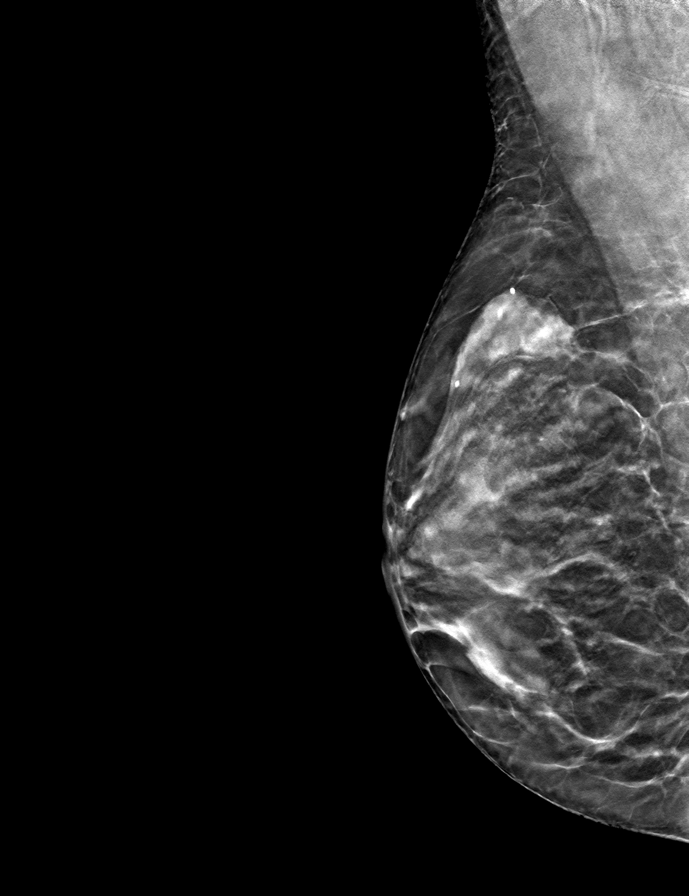

[L MLO tomo · tomo slice 26/51.0]
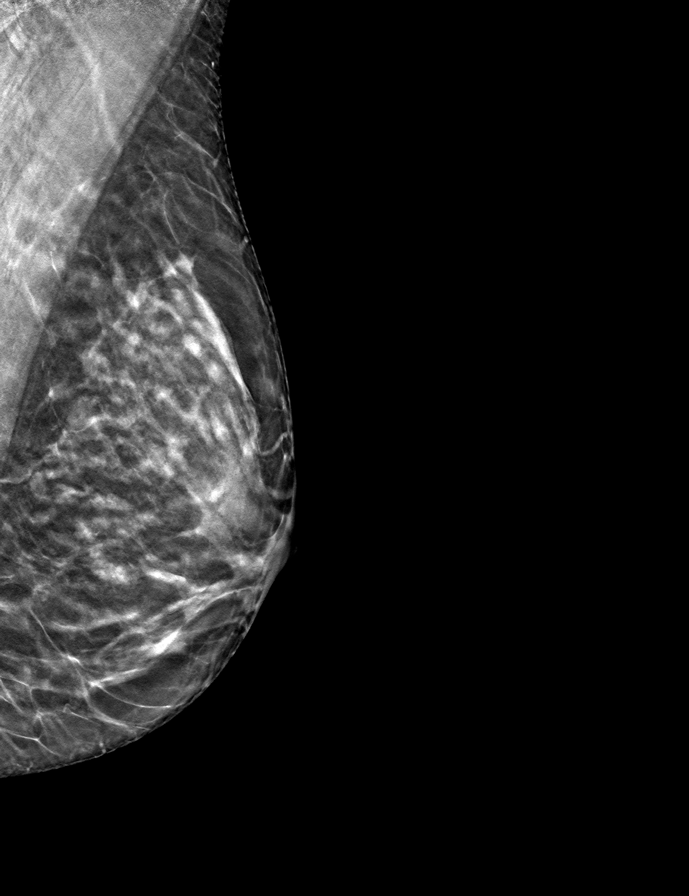

[9 of 24 positions shown; findings below may reference images not displayed]

ACR Breast Density Category c: The breast tissue is heterogeneously
dense, which may obscure small masses.
FINDINGS: There are no findings suspicious for malignancy. The images were
evaluated with computer-aided detection.
IMPRESSION: No mammographic evidence of malignancy. A result letter of this
screening mammogram will be mailed directly to the patient.

RECOMMENDATION:
Screening mammogram in one year. (Code:T4-5-GWO)

BI-RADS CATEGORY  1: Negative.

## 2023-04-15 ENCOUNTER — Emergency Department (HOSPITAL_COMMUNITY): Payer: BC Managed Care – PPO

## 2023-04-15 ENCOUNTER — Emergency Department (HOSPITAL_COMMUNITY)
Admission: EM | Admit: 2023-04-15 | Discharge: 2023-04-15 | Disposition: A | Discharge status: Home / Self Care | Payer: BC Managed Care – PPO | Attending: Emergency Medicine | Admitting: Emergency Medicine

## 2023-04-15 ENCOUNTER — Other Ambulatory Visit: Payer: Self-pay

## 2023-04-15 DIAGNOSIS — F17210 Nicotine dependence, cigarettes, uncomplicated: Secondary | ICD-10-CM | POA: Diagnosis not present

## 2023-04-15 DIAGNOSIS — Z79899 Other long term (current) drug therapy: Secondary | ICD-10-CM | POA: Diagnosis not present

## 2023-04-15 DIAGNOSIS — R0789 Other chest pain: Secondary | ICD-10-CM | POA: Diagnosis present

## 2023-04-15 DIAGNOSIS — I10 Essential (primary) hypertension: Secondary | ICD-10-CM

## 2023-04-15 LAB — BASIC METABOLIC PANEL
Anion gap: 10 (ref 5–15)
BUN: 17 mg/dL (ref 6–20)
CO2: 22 mmol/L (ref 22–32)
Calcium: 9.6 mg/dL (ref 8.9–10.3)
Chloride: 102 mmol/L (ref 98–111)
Creatinine, Ser: 0.63 mg/dL (ref 0.44–1.00)
GFR, Estimated: >60 mL/min (ref >60)
Glucose, Bld: 114 mg/dL — ABNORMAL HIGH (ref 70–99)
Potassium: 3.4 mmol/L — ABNORMAL LOW (ref 3.5–5.1)
Sodium: 134 mmol/L — ABNORMAL LOW (ref 135–145)

## 2023-04-15 LAB — CBC
HCT: 42.6 % (ref 36.0–46.0)
Hemoglobin: 14.2 g/dL (ref 12.0–15.0)
MCH: 31 pg (ref 26.0–34.0)
MCHC: 33.3 g/dL (ref 30.0–36.0)
MCV: 93 fL (ref 80.0–100.0)
Platelets: 300 10*3/uL (ref 150–400)
RBC: 4.58 MIL/uL (ref 3.87–5.11)
RDW: 12.7 % (ref 11.5–15.5)
WBC: 11 10*3/uL — ABNORMAL HIGH (ref 4.0–10.5)
nRBC: 0 % (ref 0.0–0.2)

## 2023-04-15 LAB — TROPONIN I (HIGH SENSITIVITY): Troponin I (High Sensitivity): 4 ng/L (ref <18)

## 2023-04-15 MED ORDER — ACETAMINOPHEN 325 MG PO TABS
650.0000 mg | ORAL_TABLET | Freq: Once | ORAL | Status: AC
  Administered 2023-04-15: 650 mg via ORAL
  Filled 2023-04-15: qty 2

## 2023-04-15 MED ORDER — AMLODIPINE BESYLATE 10 MG PO TABS: 10.0000 mg | ORAL_TABLET | Freq: Every day | ORAL | 0 refills | Status: AC

## 2023-04-15 NOTE — ED Triage Notes (Signed)
Pt arrived via POV. C/o HA, and chest tightness for 1x week. No hx heart problems.   AOx4

## 2023-04-15 NOTE — ED Notes (Signed)
Pt refused chest x-ray. Stating they feel like this is anxiety

## 2023-04-15 NOTE — ED Provider Triage Note (Signed)
Emergency Medicine Provider Triage Evaluation Note  Patricia Andersen , a 57 y.o. female  was evaluated in triage.  Pt complains of headache, chest tightness for 1 week, she also endorses some odd sensation in the right arm she describes as tingling intermittently.  She does not have any sensory deficit of the right arm, or numbness at this time.  She reports has been going on intermittently for 1 week.  She smokes about a pack of cigarettes a day, she has had a history of high blood pressure in the past but does not take any medication for it, reports that it is always normal when she goes to her primary care doctor.  Review of Systems  Positive: HTN, hld, tobacco use Negative: Nausea, vomiting, abdominal pain  Physical Exam  BP (!) 179/81 (BP Location: Left Arm)   Pulse (!) 109   Temp 98.1 F (36.7 C) (Oral)   Resp 18   Ht 5\' 5"  (1.651 m)   Wt 55.8 kg   SpO2 97%   BMI 20.47 kg/m  Gen:   Awake, no distress   Resp:  Normal effort  MSK:   Moves extremities without difficulty  Other:  Cranial nerves II through XII grossly intact.  Intact finger-nose, intact heel-to-shin.  Romberg negative, gait normal.  Alert and oriented x3.  Moves all 4 limbs spontaneously, normal coordination.  No pronator drift.  Intact strength 5 out of 5 bilateral upper and lower extremities.  Medical Decision Making  Medically screening exam initiated at 2:10 PM.  Appropriate orders placed.  FARYAL BEHR was informed that the remainder of the evaluation will be completed by another provider, this initial triage assessment does not replace that evaluation, and the importance of remaining in the ED until their evaluation is complete.  As above   Olene Floss, PA-C 04/15/23 1410

## 2023-04-15 NOTE — Discharge Instructions (Addendum)
We discussed your workup in the emergency department today.  I do recommend that you see a cardiologist for your heart risk factors, including your high blood pressure, smoking and high cholesterol.  I placed a referral in the system and they should contact you within the next 3 business days to set up an appointment.  If you do not hear from them please call the number above.  I also prescribed a medicine called amlodipine 10 mg to take once daily for high blood pressure.  This is a temporary 30-day supply.  You will need to keep monitoring her blood pressure at home, keep a daily diary, and follow-up with your primary care doctor or cardiologist for your high blood pressure.  Please make every effort to quit smoking.  Smoking can be a serious risk factor for stroke, heart disease, lung cancer, hypertension, and other medical issues.  *  Finally, we discussed your workup in the ER today.  Although we did not see any emergencies such as signs of heart attack, I did recommend chest x-rays and repeat troponin blood test.  You did not want to have these tests done.  You understand that by leaving without completing this typical workup, we may miss a diagnosis for serious medical condition.  You understand that if your chest pain or pressure is to worsen, or if you develop lightheadedness, loss of consciousness, difficulty breathing, stroke symptoms, or any other emergency medical concerns, you should return to the ER immediately.

## 2023-04-15 NOTE — ED Provider Notes (Signed)
[facility] Provider Note   CSN: [id number] Arrival date & time: 04/15/23  1314     History  Chief Complaint  Patient presents with   Chest Pain   Headache    Patricia Andersen is a 57 y.o. female with history of smoking, high cholesterol, presented to ED with several complaints.  Patient ports she has about a week of intermittent symptoms of lightheadedness, intermittent chest tightness, and feeling fatigued.  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  She says the discomfort is not necessarily associated with exertion.  She denies any known history of coronary disease or significant family history of coronary disease or MI at a young age.  She does report her blood pressure has fluctuated this week, much higher than normal, and reports that she was on blood pressure medicines in the past but stopped several years ago because "my blood pressure was fine".  HPI     Home Medications Prior to Admission medications   Medication Sig Start Date End Date Taking? Authorizing Provider  amLODipine (NORVASC) 10 MG tablet Take 1 tablet (10 mg total) by mouth daily. 04/15/23 05/15/23 Yes Vyla Pint, Kermit Balo, MD  diclofenac (VOLTAREN) 75 MG EC tablet TAKE 1 TABLET (75 MG TOTAL) BY MOUTH 2 (TWO) TIMES DAILY WITH A MEAL. 07/06/22   Brock Bad, MD  ibuprofen (ADVIL,MOTRIN) 200 MG tablet Take 200 mg by mouth every 6 (six) hours as needed for pain. Patient not taking: Reported on 02/22/2022    [provider]  ibuprofen (ADVIL,MOTRIN) 800 MG tablet Take 1 tablet (800 mg total) by mouth every 8 (eight) hours as needed. Patient not taking: Reported on 05/20/2015 05/11/14   Brock Bad, MD  LORazepam (ATIVAN) 1 MG tablet Take 1 mg by mouth every 8 (eight) hours.    [provider]  rosuvastatin (CRESTOR) 10 MG tablet Take 10 mg by mouth  daily. 03/17/21   [provider]      Allergies    Patient has no known allergies.    Review of Systems   Review of Systems  Physical Exam Updated Vital Signs BP (!) 179/81 (BP Location: Left Arm)   Pulse (!) 109   Temp 98.1 F (36.7 C) (Oral)   Resp 18   Ht 5\' 5"  (1.651 m)   Wt 55.8 kg   SpO2 97%   BMI 20.47 kg/m  Physical Exam Constitutional:      General: She is not in acute distress. HENT:     Head: Normocephalic and atraumatic.  Eyes:     Conjunctiva/sclera: Conjunctivae normal.     Pupils: Pupils are equal, round, and reactive to light.  Cardiovascular:     Rate and Rhythm: Normal rate and regular rhythm.  Pulmonary:     Effort: Pulmonary effort is normal. No respiratory distress.  Abdominal:     General: There is no distension.     Tenderness: There is no abdominal tenderness.  Skin:    General: Skin is warm and dry.  Neurological:     General: No focal deficit present.     Mental Status: She is alert and oriented to person, place, and time. Mental status is at baseline.  Psychiatric:        Mood and Affect: Mood normal.        Behavior: Behavior  normal.     ED Results / Procedures / Treatments   Labs (all labs ordered are listed, but only abnormal results are displayed) Labs Reviewed  BASIC METABOLIC PANEL - Abnormal; Notable for the following components:      Result Value   Sodium 134 (*)    Potassium 3.4 (*)    Glucose, Bld 114 (*)    All other components within normal limits  CBC - Abnormal; Notable for the following components:   WBC 11.0 (*)    All other components within normal limits  TROPONIN I (HIGH SENSITIVITY)  TROPONIN I (HIGH SENSITIVITY)    EKG EKG Interpretation  Date/Time:  Sunday April 15 2023 13:40:39 EDT Ventricular Rate:  92 PR Interval:  128 QRS Duration: 85 QT Interval:  355 QTC Calculation: 440 R Axis:   83 Text Interpretation: Sinus rhythm Consider left ventricular hypertrophy Confirmed by Alvester Chou [phone]) on 04/15/2023 1:59:01 PM  Radiology No results found.  Procedures Procedures    Medications Ordered in ED Medications  acetaminophen (TYLENOL) tablet 650 mg (650 mg Oral Given 04/15/23 1425)    ED Course/ Medical Decision Making/ A&P                             Medical Decision Making Amount and/or Complexity of Data Reviewed Labs: ordered. Radiology: ordered.  Risk Prescription drug management.   This patient presents to the Emergency Department with complaint of chest discomfort, now relieved.  This involves an extensive number of treatment options, and is a complaint that carries with it a high risk of complications and morbidity, given the patient's comorbidity, including smoking and hypertension and hyperlipidemia.The differential diagnosis includes ACS vs Pneumothorax vs Reflux/Gastritis vs MSK pain vs Pneumonia vs other.  I felt PE was less likely given that patient is no acute risk factors for PE, no hypoxia, symptoms are intermittent  I ordered, reviewed, and interpreted labs.  Pertinent results include no emergent findings.  Initial troponin negative. Patient is not having any active chest discomfort or pain or lightheadedness on my evaluation.  X-rays were ordered for chest pain evaluation but the patient refuses imaging.  I do not hear any evidence of large pneumothorax, or audible findings consistent with pneumonia, but the patient understands that without imaging there is difficulty exclude these entirely as possibilities.  I personally reviewed the patients ECG which showed sinus rhythm with no acute ischemic findings  Low suspicion for CVA/stroke clinically - no neurological deficits - no emergent indication for neuroimaging.  Based on the patient's clinical exam, vital signs, risk factors, and ED testing, I felt that the patient's overall risk of life-threatening emergency such as ACS, PE, sepsis, or infection was low.  At this time, I felt the  patient's presentation was most clinically consistent with nonspecific chest pain, but explained to the patient that this evaluation was not a definitive diagnostic workup.  With her cardiovascular risk factors, I will refer her to cardiology for further evaluation.  I will start her on amlodipine for I suspect untreated hypertension.  I did counsel the patient about smoking cessation.  Patient is chosen to leave without completing typical workup, including chest x-ray and second troponin.  She understands the risk of leaving now completing a workup and these were delineated as well in her discharge instructions.        Final Clinical Impression(s) / ED Diagnoses Final diagnoses:  Hypertension, unspecified type  Chest pressure  Rx / DC Orders ED Discharge Orders          Ordered    amLODipine (NORVASC) 10 MG tablet  Daily        04/15/23 1551    Ambulatory referral to Cardiology        04/15/23 1551              Tamarion Haymond, Kermit Balo, MD 04/15/23 1600

## 2023-11-26 ENCOUNTER — Other Ambulatory Visit: Payer: Self-pay

## 2023-11-26 ENCOUNTER — Encounter (HOSPITAL_COMMUNITY): Payer: Self-pay | Admitting: Emergency Medicine

## 2023-11-26 ENCOUNTER — Emergency Department (HOSPITAL_COMMUNITY)
Admission: EM | Admit: 2023-11-26 | Discharge: 2023-11-26 | Disposition: A | Discharge status: Home / Self Care | Payer: BC Managed Care – PPO | Attending: Emergency Medicine | Admitting: Emergency Medicine

## 2023-11-26 ENCOUNTER — Emergency Department (HOSPITAL_COMMUNITY): Payer: BC Managed Care – PPO

## 2023-11-26 DIAGNOSIS — Z79899 Other long term (current) drug therapy: Secondary | ICD-10-CM | POA: Diagnosis not present

## 2023-11-26 DIAGNOSIS — R079 Chest pain, unspecified: Secondary | ICD-10-CM | POA: Insufficient documentation

## 2023-11-26 DIAGNOSIS — I10 Essential (primary) hypertension: Secondary | ICD-10-CM | POA: Diagnosis not present

## 2023-11-26 LAB — CBC
HCT: 42.1 % (ref 36.0–46.0)
Hemoglobin: 14.3 g/dL (ref 12.0–15.0)
MCH: 31 pg (ref 26.0–34.0)
MCHC: 34 g/dL (ref 30.0–36.0)
MCV: 91.1 fL (ref 80.0–100.0)
Platelets: 324 10*3/uL (ref 150–400)
RBC: 4.62 MIL/uL (ref 3.87–5.11)
RDW: 12.2 % (ref 11.5–15.5)
WBC: 10.2 10*3/uL (ref 4.0–10.5)
nRBC: 0 % (ref 0.0–0.2)

## 2023-11-26 LAB — COMPREHENSIVE METABOLIC PANEL
ALT: 15 U/L (ref 0–44)
AST: 21 U/L (ref 15–41)
Albumin: 4.1 g/dL (ref 3.5–5.0)
Alkaline Phosphatase: 76 U/L (ref 38–126)
Anion gap: 13 (ref 5–15)
BUN: 9 mg/dL (ref 6–20)
CO2: 22 mmol/L (ref 22–32)
Calcium: 9.6 mg/dL (ref 8.9–10.3)
Chloride: 103 mmol/L (ref 98–111)
Creatinine, Ser: 0.78 mg/dL (ref 0.44–1.00)
GFR, Estimated: >60 mL/min (ref >60)
Glucose, Bld: 105 mg/dL — ABNORMAL HIGH (ref 70–99)
Potassium: 3.9 mmol/L (ref 3.5–5.1)
Sodium: 138 mmol/L (ref 135–145)
Total Bilirubin: 0.5 mg/dL (ref 0.0–1.2)
Total Protein: 7.1 g/dL (ref 6.5–8.1)

## 2023-11-26 LAB — LIPASE, BLOOD: Lipase: 35 U/L (ref 11–51)

## 2023-11-26 LAB — TROPONIN I (HIGH SENSITIVITY)
Troponin I (High Sensitivity): 4 ng/L (ref <18)
Troponin I (High Sensitivity): 4 ng/L (ref <18)

## 2023-11-26 NOTE — Discharge Instructions (Signed)
You were seen in the emerged part for chest pain Your blood work EKG and chest x-ray all looked okay Your chest pain resolved and you were able to eat and drink without issue We are not certain what was causing your chest pain but do not think you are having a heart attack or need to be admitted for further evaluation at this time It is important that you follow-up with your primary care doctor again for reevaluation within the next week Return to the Emergency Department for chest pain trouble breathing or any other concerns

## 2023-11-26 NOTE — ED Triage Notes (Addendum)
Chest pressure that started yesterday, worse with eating or drinking (feels "stuck", and pressure with drinking). Denies radiation to neck , back, endorses sporadic "cold feeling" in her R arm that is not currently present.. Endorses HA, weakness. Denies fever or known sick contacts

## 2023-11-26 NOTE — ED Provider Notes (Signed)
[place] EMERGENCY DEPARTMENT AT [facility] Provider Note   CSN: [id number] Arrival date & time: 11/26/23  1220     History  Chief Complaint  Patient presents with   Chest Pain    Patricia Andersen is a 58 y.o. female.  With a history of hypertension hyper lipidemia presenting for chest pain.  Chest pain began yesterday morning and has persisted through today.  Pain localized over substernal region and feels as though she has something "stuck".  Exacerbated by anxiety and feels as though she "threw herself into a panic attack".  No shortness of breath nausea vomiting or diaphoresis.  Denies fevers chills and recent illness.  States chest pain has resolved since waiting in the ED nearly 7 hours   Chest Pain      Home Medications Prior to Admission medications   Medication Sig Start Date End Date Taking? Authorizing Provider  amLODipine (NORVASC) 10 MG tablet Take 1 tablet (10 mg total) by mouth daily. 04/15/23 05/15/23  Terald Sleeper, MD  diclofenac (VOLTAREN) 75 MG EC tablet TAKE 1 TABLET (75 MG TOTAL) BY MOUTH 2 (TWO) TIMES DAILY WITH A MEAL. 07/06/22   Brock Bad, MD  ibuprofen (ADVIL,MOTRIN) 200 MG tablet Take 200 mg by mouth every 6 (six) hours as needed for pain. Patient not taking: Reported on 02/22/2022    [provider]  ibuprofen (ADVIL,MOTRIN) 800 MG tablet Take 1 tablet (800 mg total) by mouth every 8 (eight) hours as needed. Patient not taking: Reported on 05/20/2015 05/11/14   Brock Bad, MD  LORazepam (ATIVAN) 1 MG tablet Take 1 mg by mouth every 8 (eight) hours.    [provider]  rosuvastatin (CRESTOR) 10 MG tablet Take 10 mg by mouth daily. 03/17/21   [provider]      Allergies    Patient has no known allergies.    Review of Systems   Review of Systems  Cardiovascular:  Positive for chest pain.    Physical Exam Updated Vital Signs BP 135/61 (BP Location: Left Arm)   Pulse (!) 58   Temp 97.9 F (36.6  C) (Oral)   Resp 16   Wt 55 kg   SpO2 98%   BMI 20.18 kg/m  Physical Exam Vitals and nursing note reviewed.  HENT:     Head: Normocephalic and atraumatic.  Eyes:     Pupils: Pupils are equal, round, and reactive to light.  Cardiovascular:     Rate and Rhythm: Normal rate and regular rhythm.  Pulmonary:     Effort: Pulmonary effort is normal.     Breath sounds: Normal breath sounds.  Abdominal:     Palpations: Abdomen is soft.     Tenderness: There is no abdominal tenderness.  Skin:    General: Skin is warm and dry.  Neurological:     Mental Status: She is alert.  Psychiatric:        Mood and Affect: Mood normal.     ED Results / Procedures / Treatments   Labs (all labs ordered are listed, but only abnormal results are displayed) Labs Reviewed  COMPREHENSIVE METABOLIC PANEL - Abnormal; Notable for the following components:      Result Value   Glucose, Bld 105 (*)    All other components within normal limits  CBC  LIPASE, BLOOD  TROPONIN I (HIGH SENSITIVITY)  TROPONIN I (HIGH SENSITIVITY)    EKG EKG Interpretation Date/Time:  Monday November 26 2023 12:57:12 EST Ventricular  Rate:  79 PR Interval:  132 QRS Duration:  76 QT Interval:  376 QTC Calculation: 431 R Axis:   87  Text Interpretation: Normal sinus rhythm Right atrial enlargement Cannot rule out Anterior infarct , age undetermined Abnormal ECG When compared with ECG of 15-Apr-2023 13:40, PREVIOUS ECG IS PRESENT Confirmed by Estelle June [phone]) on 11/26/2023 6:48:03 PM  Radiology DG Chest 2 View Result Date: 11/26/2023 CLINICAL DATA:  Chest pain, shortness of breath EXAM: CHEST - 2 VIEW COMPARISON:  09/15/2014 FINDINGS: Heart and mediastinal contours are within normal limits. No focal opacities or effusions. No acute bony abnormality. IMPRESSION: No active cardiopulmonary disease. Electronically Signed   By: Charlett Nose M.D.   On: 11/26/2023 13:33    Procedures Procedures    Medications Ordered in  ED Medications - No data to display  ED Course/ Medical Decision Making/ A&P                                 Medical Decision Making 58 year old female with history as above presenting for chest pain that started yesterday.  Substernal chest pain persisted through today.  Afebrile and normotensive here.  Well-appearing on my exam with complete resolution of her chest pain after waiting here nearly 7 hours.  Differential diagnosis includes ACS, dysrhythmia, pneumonia, viral respiratory illness and food impaction.    She has been able to eat and drink here without issue.  Low suspicion for food impaction. No ischemic changes or evidence of dysrhythmia on EKG.  Initial troponin and delta troponin of 4 not consistent with ACS Chest x-ray shows no acute disease. No fevers or other viral syndrome  Unclear cause of the patient's chest discomfort but given that her cardiac workup is negative and she is eating and drinking here without discomfort feel comfortable with discharge with plan for PCP follow-up.  Return precautions were discussed in detail.  Amount and/or Complexity of Data Reviewed Labs: ordered. Radiology: ordered.           Final Clinical Impression(s) / ED Diagnoses Final diagnoses:  Chest pain, unspecified type    Rx / DC Orders ED Discharge Orders     None         Royanne Foots, DO 11/26/23 1920

## 2023-11-26 NOTE — ED Provider Triage Note (Signed)
Emergency Medicine Provider Triage Evaluation Note  Patricia Andersen , a 58 y.o. female  was evaluated in triage.  Pt complains of epigastric pain.  Review of Systems  Positive: Feeling like pressure in sternum after eating Negative: N/V/D, CP, SHOB, Fever, abd pain  Physical Exam  BP 132/65   Pulse 92   Temp 97.8 F (36.6 C)   Resp 17   Wt 55 kg   SpO2 100%   BMI 20.18 kg/m  Gen:   Awake, no distress   Resp:  Normal effort  MSK:   Moves extremities without difficulty  Other:    Medical Decision Making  Medically screening exam initiated at 1:43 PM.  Appropriate orders placed.  BETHAN ADAMEK was informed that the remainder of the evaluation will be completed by another provider, this initial triage assessment does not replace that evaluation, and the importance of remaining in the ED until their evaluation is complete.  Labs ordered   Dolphus Jenny, PA-C 11/26/23 1345

## 2023-11-28 ENCOUNTER — Telehealth: Payer: Self-pay

## 2023-11-28 ENCOUNTER — Telehealth: Payer: Self-pay | Admitting: Gastroenterology

## 2023-11-28 ENCOUNTER — Encounter: Payer: Self-pay | Admitting: Physician Assistant

## 2023-11-28 ENCOUNTER — Ambulatory Visit: Payer: BC Managed Care – PPO | Admitting: Physician Assistant

## 2023-11-28 VITALS — BP 110/70 | HR 96 | Ht 65.0 in | Wt 120.5 lb

## 2023-11-28 DIAGNOSIS — R0789 Other chest pain: Secondary | ICD-10-CM

## 2023-11-28 DIAGNOSIS — R142 Eructation: Secondary | ICD-10-CM

## 2023-11-28 DIAGNOSIS — R1310 Dysphagia, unspecified: Secondary | ICD-10-CM

## 2023-11-28 MED ORDER — OMEPRAZOLE 40 MG PO CPDR: DELAYED_RELEASE_CAPSULE | ORAL | 5 refills | Status: DC

## 2023-11-28 NOTE — Patient Instructions (Signed)
We have sent the following medications to your pharmacy for you to pick up at your convenience: omeprazole 40 mg to open capsule and sprinkle on applesauce twice a day.  You have been scheduled for an endoscopy. Please follow written instructions given to you at your visit today.  If you use inhalers (even only as needed), please bring them with you on the day of your procedure.  If you take any of the following medications, they will need to be adjusted prior to your procedure:   DO NOT TAKE 7 DAYS PRIOR TO TEST- Trulicity (dulaglutide) Ozempic, Wegovy (semaglutide) Mounjaro (tirzepatide) Bydureon Bcise (exanatide extended release)  DO NOT TAKE 1 DAY PRIOR TO YOUR TEST Rybelsus (semaglutide) Adlyxin (lixisenatide) Victoza (liraglutide) Byetta (exanatide) ____________________________________________________________________  You have been scheduled for an endoscopy. Please follow written instructions given to you at your visit today.  If you use inhalers (even only as needed), please bring them with you on the day of your procedure.  If you take any of the following medications, they will need to be adjusted prior to your procedure:   DO NOT TAKE 7 DAYS PRIOR TO TEST- Trulicity (dulaglutide) Ozempic, Wegovy (semaglutide) Mounjaro (tirzepatide) Bydureon Bcise (exanatide extended release)  DO NOT TAKE 1 DAY PRIOR TO YOUR TEST Rybelsus (semaglutide) Adlyxin (lixisenatide) Victoza (liraglutide) Byetta (exanatide) ___________________________________________________________________________

## 2023-11-28 NOTE — Progress Notes (Signed)
Chief Complaint: Atypical chest pain  HPI:    Patricia Andersen is a 58 year old female, known to Dr. Adela Lank, with a past medical history as listed below, who presents to clinic today for follow-up after being seen in the ER for atypical chest pain.    04/05/2021 colonoscopy with one 3 to 4 mm polyp in the transverse colon and one 3 mm polyp at the rectosigmoid colon as well as internal hemorrhoids.  Pathology showed adenomatous and repeat recommended in 7 years.    11/26/2023 patient seen in the ER for chest pain.  Apparently this started the morning of 1/26 and have continued.  Localized over the substernal region and felt as though she had something "stuck".  Exacerbated by anxiety.  Apparently chest pain resolved after waiting in the ED for 7 hours.  Labs including CBC and CMP as well as lipase and troponins were negative/normal other than a glucose of 105.  EKG with normal sinus rhythm and right atrial enlargement.  Chest x-ray with no active cardiopulmonary disease.    Today, patient presents to clinic and tells me that on Sunday she started with a tightness/discomfort in the middle of her chest right over the middle of her esophagus.  This continued into the next day when she went to see her PCP.  They did workup there including EKG and labs which were normal but she was told to go to the ER to rule out cardiac cause.  She sat there for hours and then was told it was not her heart and she needed to come see [place].         Tells me since then she has been staying on a clear liquid/broth diet but yesterday did try to take a bite of mac & cheese, she took 2 bites and then felt like she had extreme discomfort right over the same spot and that this food was not going down.  She drank some stuff behind it which also felt like it took forever to go down.  Along with this has been experiencing a lot of eructations.  She does smoke.  Otherwise no chronic reflux symptoms.    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    Denies fever, chills, weight loss, abdominal pain, nausea, vomiting or symptoms that awaken her from sleep.  Past Medical History:  Diagnosis Date   Hyperlipidemia    Hypertension     Past Surgical History:  Procedure Laterality Date   BREAST CYST ASPIRATION Right 2016   CESAREAN SECTION     x 2   TUBAL LIGATION     WISDOM TOOTH EXTRACTION      Current Outpatient Medications  Medication Sig Dispense Refill   amLODipine (NORVASC) 10 MG tablet Take 1 tablet (10 mg total) by mouth daily. 30 tablet 0   diclofenac (VOLTAREN) 75 MG EC tablet TAKE 1 TABLET (75 MG TOTAL) BY MOUTH 2 (TWO) TIMES DAILY WITH A MEAL. 60 tablet 2   ibuprofen (ADVIL,MOTRIN) 200 MG tablet Take 200 mg by mouth every 6 (six) hours as needed for pain. (Patient not taking: Reported on 02/22/2022)     ibuprofen (ADVIL,MOTRIN) 800 MG tablet Take 1 tablet (800 mg total) by mouth every 8 (eight) hours as needed. (Patient not taking: Reported on 05/20/2015) 30 tablet 5   LORazepam (ATIVAN) 1 MG tablet Take 1 mg  by mouth every 8 (eight) hours.     rosuvastatin (CRESTOR) 10 MG tablet Take 10 mg by mouth daily.     No current facility-administered medications for this visit.    Allergies as of 11/28/2023   (No Known Allergies)    Family History  Problem Relation Age of Onset   Heart attack Mother    Hyperlipidemia Mother    Cancer Father    Hypertension Sister    Cancer Sister    Heart disease Brother    Hypertension Other    Colon polyps Neg Hx    Colon cancer Neg Hx    Esophageal cancer Neg Hx    Rectal cancer Neg Hx    Stomach cancer Neg Hx     Social History   Socioeconomic History   Marital status: Married    Spouse name: Jamika Sadek   Number of children: 3   Years of education: Not on file   Highest education level: Not on file  Occupational History   Occupation: Futures trader    Occupation: Facilities manager  Tobacco Use   Smoking status: Every Day    Current packs/day: 1.00    Types: Cigarettes   Smokeless tobacco: Never  Vaping Use   Vaping status: Never Used  Substance and Sexual Activity   Alcohol use: No    Alcohol/week: 0.0 standard drinks of alcohol   Drug use: No   Sexual activity: Yes    Partners: Male    Birth control/protection: Surgical  Other Topics Concern   Not on file  Social History Narrative   Not on file   Social Drivers of Health   Financial Resource Strain: Not on file  Food Insecurity: Not on file  Transportation Needs: Not on file  Physical Activity: Not on file  Stress: Not on file  Social Connections: Not on file  Intimate Partner Violence: Not on file    Review of Systems:    Constitutional: No weight loss, fever or chills Skin: No rash  Cardiovascular: No palpitations   Respiratory: No SOB  Gastrointestinal: See HPI and otherwise negative Genitourinary: No dysuria  Neurological: No headache, dizziness or syncope Musculoskeletal: No new muscle or joint pain Hematologic: No bleeding  Psychiatric: No history of depression or anxiety   Physical Exam:  Vital signs: BP 110/70 (BP Location: Left Arm, Patient Position: Sitting, Cuff Size: Normal)   Pulse 96   Ht 5\' 5"  (1.651 m)   Wt 120 lb 8 oz (54.7 kg)   BMI 20.05 kg/m    Constitutional:   Pleasant thin appearing Caucasian female appears to be in NAD, Well developed, Well nourished, alert and cooperative Head:  Normocephalic and atraumatic. Eyes:   PEERL, EOMI. No icterus. Conjunctiva pink. Ears:  Normal auditory acuity. Neck:  Supple Throat: Oral cavity and pharynx without inflammation, swelling or lesion.  Respiratory: Respirations even and unlabored. Lungs clear to auscultation bilaterally.   No wheezes, crackles, or rhonchi.  Cardiovascular: Normal S1, S2. No MRG. Regular rate and rhythm. No peripheral edema, cyanosis or pallor.  Gastrointestinal:  Soft,  nondistended, nontender. No rebound or guarding. Normal bowel sounds. No appreciable masses or hepatomegaly. Rectal:  Not performed.  Msk:  Symmetrical without gross deformities. Without edema, no deformity or joint abnormality.  Neurologic:  Alert and  oriented x4;  grossly normal neurologically.  Skin:   Dry and intact without significant lesions or rashes. Psychiatric:  Demonstrates good judgement and reason without abnormal affect or behaviors.  RELEVANT LABS AND IMAGING: CBC  Component Value Date/Time   WBC 10.2 11/26/2023 1354   RBC 4.62 11/26/2023 1354   HGB 14.3 11/26/2023 1354   HCT 42.1 11/26/2023 1354   PLT 324 11/26/2023 1354   MCV 91.1 11/26/2023 1354   MCH 31.0 11/26/2023 1354   MCHC 34.0 11/26/2023 1354   RDW 12.2 11/26/2023 1354    CMP     Component Value Date/Time   NA 138 11/26/2023 1345   NA 142 11/28/2018 1100   K 3.9 11/26/2023 1345   CL 103 11/26/2023 1345   CO2 22 11/26/2023 1345   GLUCOSE 105 (H) 11/26/2023 1345   BUN 9 11/26/2023 1345   BUN 8 11/28/2018 1100   CREATININE 0.78 11/26/2023 1345   CREATININE 0.67 04/10/2013 1739   CALCIUM 9.6 11/26/2023 1345   PROT 7.1 11/26/2023 1345   PROT 7.4 11/28/2018 1100   ALBUMIN 4.1 11/26/2023 1345   ALBUMIN 4.4 11/28/2018 1100   AST 21 11/26/2023 1345   ALT 15 11/26/2023 1345   ALKPHOS 76 11/26/2023 1345   BILITOT 0.5 11/26/2023 1345   BILITOT 0.3 11/28/2018 1100   GFRNONAA >60 11/26/2023 1345   GFRAA 111 11/28/2018 1100    Assessment: 1.  Dysphagia: Increased atypical chest pain when eating food which feels like it gets hung in her throat over the past week, evaluation in the ER with negative cardiac workup; most likely esophageal stricture versus spasm versus other 2.  Eructations 3.  Atypical chest pain: With above  Plan: 1.  Scheduled patient for a diagnostic EGD with dilation in the [facility] with Dr. Adela Lank next week.  Did provide the patient a detailed list of risks for the procedure and she  agrees to proceed. Patient is appropriate for endoscopic procedure(s) in the ambulatory ([facility]) setting.  2.  Started the patient on Omeprazole 40 mg capsules to be opened and sprinkled over applesauce twice daily, 30-60 minutes before eating breakfast and dinner.  #60 with 5 refills. 3.  Reviewed anti-dysphagia measures 4.  Patient will stay on a liquid diet until after time of procedure.  Recommended boost/Ensure or Carnation breakfast shakes. 5.  Patient to follow in clinic per recommendations from Dr. Adela Lank after time of procedure.  Hyacinth Meeker, PA-C [facility] 11/28/2023, 1:55 PM

## 2023-11-28 NOTE — Telephone Encounter (Signed)
Pt scheduled to see Hyacinth Meeker PA 11/28/23 at 2pm, pt aware of appt.

## 2023-11-28 NOTE — Telephone Encounter (Signed)
Received request from [facility] medicine requesting pt be seen asap. Pt was seen in the ER this week. Pt is having epigastric pain/pressure when she eats food, feels as it is getting stuck. Pt scheduled to see Hyacinth Meeker PA today at 2pm. Pt aware of appt.

## 2023-11-28 NOTE — Progress Notes (Signed)
Agree with assessment and plan as outlined.

## 2023-11-28 NOTE — Telephone Encounter (Signed)
Inbound call from [facility] Medicine requesting for patient to be seen as soon as possible. States patient is have chest pressure and when she eats food gets stuck. Patient was recently at the ED on 1/27. Requesting a call back to the patient. Please advise, thank you.

## 2023-12-03 ENCOUNTER — Telehealth: Payer: Self-pay | Admitting: Gastroenterology

## 2023-12-03 NOTE — Telephone Encounter (Signed)
Returned the patient's phone call.She has been fever free for 24 hours no congestion or cough. Advised her to let us know if her fever comes back but that she should be ok to proceed for her procedure tomorrow.

## 2023-12-03 NOTE — Telephone Encounter (Signed)
Inbound call from patient, states over the weekend she had a fever but has not had once since Yesterday afternoon or this morning. Is feeling better, she wanted to discuss with a nurse to make sure she can proceed with procedure. She states her last temp was 98.3

## 2023-12-04 ENCOUNTER — Encounter: Payer: Self-pay | Admitting: Gastroenterology

## 2023-12-04 ENCOUNTER — Ambulatory Visit: Payer: BC Managed Care – PPO | Admitting: Gastroenterology

## 2023-12-04 VITALS — BP 96/75 | HR 59 | Temp 97.2°F | Resp 17 | Ht 65.0 in | Wt 120.8 lb

## 2023-12-04 DIAGNOSIS — R0789 Other chest pain: Secondary | ICD-10-CM

## 2023-12-04 DIAGNOSIS — K449 Diaphragmatic hernia without obstruction or gangrene: Secondary | ICD-10-CM | POA: Diagnosis present

## 2023-12-04 DIAGNOSIS — K222 Esophageal obstruction: Secondary | ICD-10-CM

## 2023-12-04 DIAGNOSIS — R1310 Dysphagia, unspecified: Secondary | ICD-10-CM

## 2023-12-04 MED ORDER — SODIUM CHLORIDE 0.9 % IV SOLN
500.0000 mL | INTRAVENOUS | Status: DC
Start: 2023-12-04 — End: 2023-12-04

## 2023-12-04 NOTE — Progress Notes (Signed)
 Called to room to assist during endoscopic procedure.  Patient ID and intended procedure confirmed with present staff. Received instructions for my participation in the procedure from the performing physician.

## 2023-12-04 NOTE — Op Note (Signed)
 [facility] Patient Name: Patricia Andersen Procedure Date: 12/04/2023 8:01 AM MRN: [id number] Endoscopist: Elspeth P. Leigh , MD, [id number] Age: 58 Referring MD:  Date of Birth: Mar 12, 1966 Gender: Female Account #: [id number] Procedure:                Upper GI endoscopy Indications:              Dysphagia, Chest pain (non cardiac) - new onset,                            just placed on omeprazole . Patient localizes this                            to lower chest / distal esophagus Medicines:                Monitored Anesthesia Care Procedure:                Pre-Anesthesia Assessment:                           - Prior to the procedure, a History and Physical                            was performed, and patient medications and                            allergies were reviewed. The patient's tolerance of                            previous anesthesia was also reviewed. The risks                            and benefits of the procedure and the sedation                            options and risks were discussed with the patient.                            All questions were answered, and informed consent                            was obtained. Prior Anticoagulants: The patient has                            taken no anticoagulant or antiplatelet agents. ASA                            Grade Assessment: II - A patient with mild systemic                            disease. After reviewing the risks and benefits,                            the patient was deemed in satisfactory condition to  undergo the procedure.                           After obtaining informed consent, the endoscope was                            passed under direct vision. Throughout the                            procedure, the patient's blood pressure, pulse, and                            oxygen saturations were monitored continuously. The                            Olympus Scope  [id number] was introduced through the                            mouth, and advanced to the second part of duodenum.                            The upper GI endoscopy was accomplished without                            difficulty. The patient tolerated the procedure                            well. Scope In: Scope Out: Findings:                 Esophagogastric landmarks were identified: the                            Z-line was found at 38 cm, the gastroesophageal                            junction was found at 38 cm and the upper extent of                            the gastric folds was found at 39 cm from the                            incisors.                           A 1 cm hiatal hernia was present.                           One benign-appearing, suspected subtle intrinsic                            mild stenosis was found 38 cm from the incisors. A                            TTS dilator was passed through the  scope. Dilation                            with an 18-19-20 mm balloon dilator was performed                            to 18 mm, 19 mm and 20 mm. No mucosal disruption                            with dilation to 20mm.                           The exam of the esophagus was otherwise normal. No                            erosive / inflammatory changes noted.                           Diffuse mildly erythematous and slightly atrophic                            mucosa was found in the entire examined stomach.                            Biopsies were taken with a cold forceps for                            Helicobacter pylori testing.                           The exam of the stomach was otherwise normal.                           The examined duodenum was normal. Complications:            No immediate complications. Estimated blood loss:                            Minimal. Estimated Blood Loss:     Estimated blood loss was minimal. Impression:               - Esophagogastric  landmarks identified.                           - 1 cm hiatal hernia.                           - Possible subtle / mild benign-appearing                            esophageal stenosis. Dilated to 20mm.                           - Normal esophagus otherwise.                           -  Erythematous and slightly atrophic mucosa in the                            stomach. Biopsied.                           - Normal stomach otherwise.                           - Normal examined duodenum. Recommendation:           - Patient has a contact number available for                            emergencies. The signs and symptoms of potential                            delayed complications were discussed with the                            patient. Return to normal activities tomorrow.                            Written discharge instructions were provided to the                            patient.                           - Resume previous diet.                           - Continue present medications including omeprazole                             for now (if that has helped).                           - Await pathology results. Elspeth P. Oliwia Berzins, MD 12/04/2023 8:21:36 AM This report has been signed electronically.

## 2023-12-04 NOTE — Progress Notes (Signed)
 Sedate, gd SR, tolerated procedure well, VSS, report to RN

## 2023-12-04 NOTE — Progress Notes (Signed)
 History and Physical Interval Note: seen in the office 11/28/23 - no interval changes. History of ED visit on 1/27 for chest pain thought to be noncardiac. Sense of dysphagia to solids in her lower chest rather acute in recent weeks with discomfort in her lower chest when this happens. Negative ED workup. Started on omeprazole  recently. Denies reflux. Discussed risks / benefits she wishes to proceed, all questions answered.    12/04/2023 8:01 AM  Montie DELENA Borrow  has presented today for endoscopic procedure(s), with the diagnosis of  Encounter Diagnoses  Name Primary?   Dysphagia, unspecified type Yes   Atypical chest pain   .  The various methods of evaluation and treatment have been discussed with the patient and/or family. After consideration of risks, benefits and other options for treatment, the patient has consented to  the endoscopic procedure(s).   The patient's history has been reviewed, patient examined, no change in status, stable for surgery.  I have reviewed the patient's chart and labs.  Questions were answered to the patient's satisfaction.    Marcey Naval, MD [facility]

## 2023-12-04 NOTE — Patient Instructions (Signed)
 Educational handout provided to patient related to Strictures  Resume previous diet  Continue present medications  Awaiting pathology results   YOU HAD AN ENDOSCOPIC PROCEDURE TODAY AT THE [facility]:   Refer to the procedure report that was given to you for any specific questions about what was found during the examination.  If the procedure report does not answer your questions, please call your gastroenterologist to clarify.  If you requested that your care partner not be given the details of your procedure findings, then the procedure report has been included in a sealed envelope for you to review at your convenience later.  YOU SHOULD EXPECT: Some feelings of bloating in the abdomen. Passage of more gas than usual.  Walking can help get rid of the air that was put into your GI tract during the procedure and reduce the bloating. If you had a lower endoscopy (such as a colonoscopy or flexible sigmoidoscopy) you may notice spotting of blood in your stool or on the toilet paper. If you underwent a bowel prep for your procedure, you may not have a normal bowel movement for a few days.  Please Note:  You might notice some irritation and congestion in your nose or some drainage.  This is from the oxygen used during your procedure.  There is no need for concern and it should clear up in a day or so.  SYMPTOMS TO REPORT IMMEDIATELY:  Following upper endoscopy (EGD)  Vomiting of blood or coffee ground material  New chest pain or pain under the shoulder blades  Painful or persistently difficult swallowing  New shortness of breath  Fever of 100F or higher  Black, tarry-looking stools  For urgent or emergent issues, a gastroenterologist can be reached at any hour by calling [phone]. Do not use MyChart messaging for urgent concerns.    DIET:  We do recommend a small meal at first, but then you may proceed to your regular diet.  Drink plenty of fluids but you should avoid  alcoholic beverages for 24 hours.  ACTIVITY:  You should plan to take it easy for the rest of today and you should NOT DRIVE or use heavy machinery until tomorrow (because of the sedation medicines used during the test).    FOLLOW UP: Our staff will call the number listed on your records the next business day following your procedure.  We will call around 7:15- 8:00 am to check on you and address any questions or concerns that you may have regarding the information given to you following your procedure. If we do not reach you, we will leave a message.     If any biopsies were taken you will be contacted by phone or by letter within the next 1-3 weeks.  Please call us  at [phone] if you have not heard about the biopsies in 3 weeks.    SIGNATURES/CONFIDENTIALITY: You and/or your care partner have signed paperwork which will be entered into your electronic medical record.  These signatures attest to the fact that that the information above on your After Visit Summary has been reviewed and is understood.  Full responsibility of the confidentiality of this discharge information lies with you and/or your care-partner.

## 2023-12-05 ENCOUNTER — Telehealth: Payer: Self-pay | Admitting: *Deleted

## 2023-12-05 NOTE — Telephone Encounter (Signed)
  Follow up Call-     12/04/2023    7:10 AM 04/05/2021    7:14 AM  Call back number  Post procedure Call Back phone  # [phone]  Permission to leave phone message Yes Yes    Post procedure follow up phone call. No answer at number given.  Left message on voicemail.

## 2023-12-06 ENCOUNTER — Encounter: Payer: Self-pay | Admitting: Gastroenterology

## 2023-12-06 LAB — SURGICAL PATHOLOGY

## 2023-12-06 NOTE — Telephone Encounter (Signed)
 Pt called into the office today after receiving pathology results via MyChart. Pt just wanted to make sure everything was okay. I reassured patient that there was no infection or bacteria to cause the redness in her stomach. Pt has been advised to continue Omeprazole  40 mg BID for 30 days, if symptoms have resolved pt can take take PRN. Pt mentioned that she was previously sprinkling Omeprazole  on applesauce and taking it that way, but she is now able to tolerate foods better. Patient has been advised that she can swallow the capsule whole if she is able to. Pt verbalized understanding and had no concerns at the end of the call.

## 2024-07-16 ENCOUNTER — Other Ambulatory Visit: Payer: Self-pay | Admitting: Family Medicine

## 2024-07-16 DIAGNOSIS — Z1231 Encounter for screening mammogram for malignant neoplasm of breast: Secondary | ICD-10-CM

## 2024-07-28 ENCOUNTER — Ambulatory Visit
Admission: RE | Admit: 2024-07-28 | Discharge: 2024-07-28 | Disposition: A | Discharge status: Home / Self Care | Source: Ambulatory Visit | Attending: Family Medicine | Admitting: Family Medicine

## 2024-07-28 DIAGNOSIS — Z1231 Encounter for screening mammogram for malignant neoplasm of breast: Secondary | ICD-10-CM

## 2024-08-20 ENCOUNTER — Emergency Department (HOSPITAL_COMMUNITY)

## 2024-08-20 ENCOUNTER — Encounter (HOSPITAL_COMMUNITY): Payer: Self-pay

## 2024-08-20 ENCOUNTER — Emergency Department (HOSPITAL_COMMUNITY)
Admission: EM | Admit: 2024-08-20 | Discharge: 2024-08-20 | Disposition: A | Discharge status: Home / Self Care | Attending: Emergency Medicine | Admitting: Emergency Medicine

## 2024-08-20 DIAGNOSIS — R0981 Nasal congestion: Secondary | ICD-10-CM | POA: Diagnosis not present

## 2024-08-20 DIAGNOSIS — Z79899 Other long term (current) drug therapy: Secondary | ICD-10-CM | POA: Insufficient documentation

## 2024-08-20 DIAGNOSIS — I10 Essential (primary) hypertension: Secondary | ICD-10-CM | POA: Diagnosis not present

## 2024-08-20 DIAGNOSIS — R531 Weakness: Secondary | ICD-10-CM

## 2024-08-20 DIAGNOSIS — E876 Hypokalemia: Secondary | ICD-10-CM | POA: Insufficient documentation

## 2024-08-20 LAB — URINALYSIS, ROUTINE W REFLEX MICROSCOPIC
Bilirubin Urine: NEGATIVE
Glucose, UA: NEGATIVE mg/dL
Hgb urine dipstick: NEGATIVE
Ketones, ur: NEGATIVE mg/dL
Leukocytes,Ua: NEGATIVE
Nitrite: NEGATIVE
Protein, ur: NEGATIVE mg/dL
Specific Gravity, Urine: 1.002 — ABNORMAL LOW (ref 1.005–1.030)
pH: 6 (ref 5.0–8.0)

## 2024-08-20 LAB — CBC
HCT: 45.2 % (ref 36.0–46.0)
Hemoglobin: 14.8 g/dL (ref 12.0–15.0)
MCH: 30.6 pg (ref 26.0–34.0)
MCHC: 32.7 g/dL (ref 30.0–36.0)
MCV: 93.4 fL (ref 80.0–100.0)
Platelets: 293 K/uL (ref 150–400)
RBC: 4.84 MIL/uL (ref 3.87–5.11)
RDW: 12.5 % (ref 11.5–15.5)
WBC: 9.3 K/uL (ref 4.0–10.5)
nRBC: 0 % (ref 0.0–0.2)

## 2024-08-20 LAB — I-STAT CHEM 8, ED
BUN: 15 mg/dL (ref 6–20)
Calcium, Ion: 1.16 mmol/L (ref 1.15–1.40)
Chloride: 103 mmol/L (ref 98–111)
Creatinine, Ser: 0.7 mg/dL (ref 0.44–1.00)
Glucose, Bld: 114 mg/dL — ABNORMAL HIGH (ref 70–99)
HCT: 45 % (ref 36.0–46.0)
Hemoglobin: 15.3 g/dL — ABNORMAL HIGH (ref 12.0–15.0)
Potassium: 3.4 mmol/L — ABNORMAL LOW (ref 3.5–5.1)
Sodium: 139 mmol/L (ref 135–145)
TCO2: 23 mmol/L (ref 22–32)

## 2024-08-20 LAB — COMPREHENSIVE METABOLIC PANEL WITH GFR
ALT: 18 U/L (ref 0–44)
AST: 25 U/L (ref 15–41)
Albumin: 4.2 g/dL (ref 3.5–5.0)
Alkaline Phosphatase: 72 U/L (ref 38–126)
Anion gap: 10 (ref 5–15)
BUN: 13 mg/dL (ref 6–20)
CO2: 22 mmol/L (ref 22–32)
Calcium: 9.1 mg/dL (ref 8.9–10.3)
Chloride: 103 mmol/L (ref 98–111)
Creatinine, Ser: 0.75 mg/dL (ref 0.44–1.00)
GFR, Estimated: >60 mL/min (ref >60)
Glucose, Bld: 116 mg/dL — ABNORMAL HIGH (ref 70–99)
Potassium: 3.4 mmol/L — ABNORMAL LOW (ref 3.5–5.1)
Sodium: 135 mmol/L (ref 135–145)
Total Bilirubin: 0.6 mg/dL (ref 0.0–1.2)
Total Protein: 7.3 g/dL (ref 6.5–8.1)

## 2024-08-20 LAB — CBG MONITORING, ED: Glucose-Capillary: 120 mg/dL — ABNORMAL HIGH (ref 70–99)

## 2024-08-20 LAB — MAGNESIUM: Magnesium: 2 mg/dL (ref 1.7–2.4)

## 2024-08-20 MED ORDER — LORAZEPAM 1 MG PO TABS
0.5000 mg | ORAL_TABLET | Freq: Once | ORAL | Status: AC
  Administered 2024-08-20: 0.5 mg via ORAL
  Filled 2024-08-20: qty 1

## 2024-08-20 NOTE — ED Triage Notes (Signed)
 Pt reports she has been sick with a cold for the past week. Today she is experiencing ringing in her ears, generalized weakness. Started on prednisone yesterday for her symptoms but feels worse today. Reports she got [facility] on the way inside but has hx of anxiety.

## 2024-08-20 NOTE — ED Provider Notes (Signed)
 [place] EMERGENCY DEPARTMENT AT [facility] Provider Note   CSN: [id number] Arrival date & time: 08/20/24  9147     Patient presents with: Weakness   Patricia Andersen is a 58 y.o. female.  Patient with past history significant for hypertension, hyperlipidemia, anxiety, and anemia presents to the emergency department with concerns of weakness and fatigue.  Reports that she has been unwell for the last 4 days with cold-like symptoms but feels that she is having increasing weakness and fatigue throughout her body.  Persistent facial numbness and that things do not feel right.  She also endorses some ringing in her ears and has been seen for this by her PCP and referred to ENT.  States this been ongoing for the last 2 or 3 weeks.  No reported vomiting or diarrhea.   Weakness      Prior to Admission medications   Medication Sig Start Date End Date Taking? Authorizing Provider  amLODipine  (NORVASC ) 10 MG tablet Take 1 tablet (10 mg total) by mouth daily. 04/15/23 12/04/23  Cottie Donnice PARAS, MD  LORazepam (ATIVAN) 0.5 MG tablet Take 1 mg by mouth daily. 11/01/23   [provider]  omeprazole  (PRILOSEC) 40 MG capsule Open up capsule and sprinkle on applesauce twice daily 11/28/23   Beather Delon Gibson, PA  rosuvastatin (CRESTOR) 10 MG tablet Take 10 mg by mouth daily. 03/17/21   [provider]    Allergies: Patient has no known allergies.    Review of Systems  Neurological:  Positive for weakness.  All other systems reviewed and are negative.   Updated Vital Signs BP (!) 140/60   Pulse (!) 51   Temp 98 F (36.7 C) (Oral)   Resp 18   Ht 5' 5 (1.651 m)   Wt 52.6 kg   SpO2 100%   BMI 19.30 kg/m   Physical Exam Vitals and nursing note reviewed.  Constitutional:      General: She is not in acute distress.    Appearance: She is well-developed.  HENT:     Head: Normocephalic and atraumatic.  Eyes:     Conjunctiva/sclera: Conjunctivae normal.   Cardiovascular:     Rate and Rhythm: Normal rate and regular rhythm.     Heart sounds: No murmur heard. Pulmonary:     Effort: Pulmonary effort is normal. No respiratory distress.     Breath sounds: Normal breath sounds.  Abdominal:     Palpations: Abdomen is soft.     Tenderness: There is no abdominal tenderness.  Musculoskeletal:        General: No swelling.     Cervical back: Neck supple.  Skin:    General: Skin is warm and dry.     Capillary Refill: Capillary refill takes less than 2 seconds.  Neurological:     General: No focal deficit present.     Mental Status: She is alert. Mental status is at baseline.     Cranial Nerves: No cranial nerve deficit.     Motor: No weakness.  Psychiatric:        Mood and Affect: Mood normal.     (all labs ordered are listed, but only abnormal results are displayed) Labs Reviewed  COMPREHENSIVE METABOLIC PANEL WITH GFR - Abnormal; Notable for the following components:      Result Value   Potassium 3.4 (*)    Glucose, Bld 116 (*)    All other components within normal limits  URINALYSIS, ROUTINE W REFLEX MICROSCOPIC - Abnormal;  Notable for the following components:   Color, Urine COLORLESS (*)    Specific Gravity, Urine 1.002 (*)    All other components within normal limits  CBG MONITORING, ED - Abnormal; Notable for the following components:   Glucose-Capillary 120 (*)    All other components within normal limits  I-STAT CHEM 8, ED - Abnormal; Notable for the following components:   Potassium 3.4 (*)    Glucose, Bld 114 (*)    Hemoglobin 15.3 (*)    All other components within normal limits  CBC  MAGNESIUM    EKG: EKG Interpretation Date/Time:  Wednesday August 20 2024 09:29:12 EDT Ventricular Rate:  66 PR Interval:  161 QRS Duration:  87 QT Interval:  404 QTC Calculation: 424 R Axis:   84  Text Interpretation: Sinus rhythm Consider left ventricular hypertrophy Nonspecific T abnrm, anterolateral leads similar to  previous Confirmed by Bari Flank [phone]) on 08/20/2024 10:37:52 AM  Radiology: [facility] Chest 2 View Result Date: 08/20/2024 CLINICAL DATA:  Patient feeling sick for proximally 1 week. EXAM: CHEST - 2 VIEW COMPARISON:  11/26/2023 FINDINGS: Lungs are adequately inflated and otherwise clear. Cardiomediastinal silhouette, bones and soft tissues are normal. IMPRESSION: No active cardiopulmonary disease. Electronically Signed   By: Toribio Agreste M.D.   On: 08/20/2024 10:48     Procedures   Medications Ordered in the ED  LORazepam (ATIVAN) tablet 0.5 mg (0.5 mg Oral Given 08/20/24 1151)                                    Medical Decision Making Amount and/or Complexity of Data Reviewed Labs: ordered. Radiology: ordered.  Risk Prescription drug management.    This patient presents to the ED for concern of weakness, this involves an extensive number of treatment options, and is a complaint that carries with it a high risk of complications and morbidity.  The differential diagnosis includes dehydration, hypokalemia, hyponatremia, UTI, medication side effect   Co morbidities that complicate the patient evaluation  Anemia, hypertension, anxiety, hyperlipidemia   Lab Tests:  I Ordered, and personally interpreted labs.  The pertinent results include: CBC is unremarkable, CMP with mild hypokalemia 3.4, UA without other signs of infection but decreased Pacific gravity, CBG slightly elevated at 120 but unexpected as patient is nonfasting   Imaging Studies ordered:  I ordered imaging studies including chest x-ray I independently visualized and interpreted imaging which showed no acute cardiopulmonary process I agree with the radiologist interpretation   Cardiac Monitoring: / EKG:  The patient was maintained on a cardiac monitor.  I personally viewed and interpreted the cardiac monitored which showed an underlying rhythm of: Sinus rhythm   Consultations Obtained:  I requested  consultation with none,  and discussed lab and imaging findings as well as pertinent plan - they recommend: N/A   Problem List / ED Course / Critical interventions / Medication management  Patient presented to the emergency department today with concerns of generalized weakness.  She states that she has been feeling unwell and being treated for sinusitis with 2 rounds of antibiotics and steroids without improvement.  She states that she has had some ringing in her ears for the last 2 or 3 weeks.  States that today while she was in the shower she began to feel shaky but did not have any sort of syncopal episode.  Denies any chest pain or shortness of breath. Physical exam  is unremarkable. No abnormal heart or lung sounds. No abdominal tenderness. Vitals are unremarkable. Patient does appear to be somewhat anxious. Labs are largely reassuring with CBC and CMP only showing slight hypokalemia at 3.4. UA negative for infection. Chest xray shows no acute cardiopulmonary process. Based on her history of recently stopping her Ativan and taking prednisone for this unclear course of symptoms, suspect likely cause is medication related. Advised that ENT follow up is reasonable as she is still dealing with congestion even after two rounds of antibiotics. Will discharge home with ambulatory referral placed to ENT. Return precautions advised and discussed. Discharged home in stable condition. I ordered medication including Ativan for anxiety  Reevaluation of the patient after these medicines showed that the patient improved I have reviewed the patients home medicines and have made adjustments as needed   Social Determinants of Health:  None   Test / Admission - Considered:  Considered but stable for outpatient follow up. ENT referral placed.  Final diagnoses:  Sinus congestion  Weakness    ED Discharge Orders          Ordered    Ambulatory referral to ENT       Comments: Chronic sinusitis    08/20/24 1308               Cecily Legrand LABOR, PA-C 08/20/24 1447    Horton, Roxie HERO, DO 08/22/24 0827

## 2024-08-20 NOTE — Discharge Instructions (Addendum)
 You were seen in the ER today for concerns of weakness and sinus pressure. Your labs and imaging were reassuring today and I suspect the symptoms you have been feeling have been primarily due to a combination of your steroids, stopping your Ativan, and this persistent sinus pressure and congestion feeling you have. I would recommend using Afrin to help manage your feelings of congestion but use this sparingly as you can develop over-dependence on this where your congestion could worsen.

## 2024-08-20 NOTE — ED Triage Notes (Signed)
 Pt states she has been sick for the past week, feels weak in her body and fatigued, states her face feels numb and she just doesn't feel right. Pt states symptoms have been ongoing for past week but weakness and numbness worse today.

## 2024-08-21 ENCOUNTER — Encounter (INDEPENDENT_AMBULATORY_CARE_PROVIDER_SITE_OTHER): Payer: Self-pay

## 2024-08-29 ENCOUNTER — Ambulatory Visit (INDEPENDENT_AMBULATORY_CARE_PROVIDER_SITE_OTHER)

## 2024-08-29 ENCOUNTER — Ambulatory Visit

## 2024-08-29 VITALS — BP 118/78 | HR 87 | Ht 65.0 in | Wt 116.2 lb

## 2024-08-29 DIAGNOSIS — E785 Hyperlipidemia, unspecified: Secondary | ICD-10-CM | POA: Diagnosis not present

## 2024-08-29 DIAGNOSIS — R0789 Other chest pain: Secondary | ICD-10-CM

## 2024-08-29 DIAGNOSIS — I10 Essential (primary) hypertension: Secondary | ICD-10-CM | POA: Diagnosis not present

## 2024-08-29 DIAGNOSIS — R0602 Shortness of breath: Secondary | ICD-10-CM

## 2024-08-29 DIAGNOSIS — R002 Palpitations: Secondary | ICD-10-CM

## 2024-08-29 NOTE — Progress Notes (Unsigned)
 Enrolled patient for a 3 day Zio XT monitor to be mailed to patients home

## 2024-08-29 NOTE — Progress Notes (Signed)
 Cardiology Office Note Date:  08/29/2024  ID:  VERONIQUE WARGA, DOB June 29, 1966, MRN [id number] PCP:  Sabas Norleen PARAS., MD  Cardiologist:   Joelle VEAR Ren Donley, MD  Chief Complaint  Patient presents with   Chest Pain      Problems CP ED visit 6/25 and 1/25 at [facility]: No evidence of ACS Worsened by anxiety and after eating/stuck Likely dysphagia --> seen by GI and plan for EGD- hiatal hernia, mild benign appearing esophageal stenosis that was dilated to 20 mm 12/2023- improvement in symptoms M: AE10, RN10 Tobacco use: 20 pack year  Visits  10/31: [facility], TTE, EM 72 hrs    History of Present Illness: JONNELL HENTGES is a 58 y.o. female who presents for dyspnea and heart fluttering.  She presented to the ED back in June with chest pain that was thought to be in the setting of anxiety.  Since then, she has been doing relatively well until about 2 weeks ago when she was exposed to someone with a viral URI.  She started to feel very shaky and had some heart fluttering about a week ago.  She went to the ED and while walking from her car to the ED, she had an acute episode where she felt short of breath, dizzy, and could not get words out.  She is concerned that she may have had a stroke.  Sounds like she did not undergo any imaging of her brain during that time.  Since then she had had persistent sinusitis that has her worried and has triggered her anxiety.  She reports that since then she has had 2 episodes where she feels very weak and her heart is fluttering.  She saw ENT this past Wednesday and was started on amoxicillin for possible sinusitis.  Her symptoms initially improved until last night when she had another episode and decided to present here.  Regarding family history, her brother had an MI in his 80s and she has a 20-pack-year smoking history.  ROS: Please see the history of present illness. All other systems are reviewed and negative.   Past Medical History:  Diagnosis Date    Anxiety    Hyperlipidemia    Hypertension     Past Surgical History:  Procedure Laterality Date   BREAST CYST ASPIRATION Right 2016   CESAREAN SECTION     x 2   TUBAL LIGATION     WISDOM TOOTH EXTRACTION      Current Outpatient Medications  Medication Sig Dispense Refill   amLODipine  (NORVASC ) 10 MG tablet Take 1 tablet (10 mg total) by mouth daily. 30 tablet 0   amoxicillin-clavulanate (AUGMENTIN) 875-125 MG tablet Take 1 tablet by mouth 2 (two) times daily.     LORazepam (ATIVAN) 0.5 MG tablet Take 1 mg by mouth daily.     rosuvastatin (CRESTOR) 10 MG tablet Take 10 mg by mouth daily.     No current facility-administered medications for this visit.    Allergies:   Patient has no known allergies.   Social History:  20-pack year tobacco use  Family History:  Brother with MI in 31s  PHYSICAL EXAM: VS:  BP 118/78   Pulse 87   Ht 5' 5 (1.651 m)   Wt 116 lb 3.2 oz (52.7 kg)   SpO2 97%   BMI 19.34 kg/m  , BMI Body mass index is 19.34 kg/m. GEN: Well nourished, well developed, in no acute distress HEENT: normal Neck: no JVD, carotid bruits, or  masses Cardiac: RRR; no murmurs, rubs, or gallops,no edema  Respiratory:  CTAB bilaterally, normal work of breathing GI: soft, nontender, nondistended, + BS Extremities: No LE edema Skin: warm and dry, no rash Neuro:  Strength and sensation are intact  Recent Labs: Reviewed  Studies: Reviewed  ASSESSMENT AND PLAN: FREDDY SPADAFORA is a 59 y.o. female who presents for chest palpitations. - She is presenting with 2-3 episodes of weakness, shortness of breath, and chest palpitation.  These episodes have occurred over the last week and a half since she presented to the ED with her acute symptoms.  She seems to be very concerned that she may have had a stroke and is concerned about the fact that she has continued to have significant tinnitus despite being started on antibiotics for sinusitis once a day.  I suspect her symptoms are  related to her anxiety.  She reported that she has very bad anxiety.  Will obtain a 2D echocardiogram and 72-hour Zio patch to rule out any cardiac etiologies of her symptoms. - Given her significant risk factors, will also obtain coronary calcium score to assess ASCVD risk. - I recommended that she discuss with her PCP about a neurology referral to obtain brain imaging to assess for possible TIA.   Signed, Joelle VEAR Ren Donley, MD  08/29/2024 12:03 PM    [facility]

## 2024-08-29 NOTE — Patient Instructions (Addendum)
 Medication Instructions:  Your physician recommends that you continue on your current medications as directed. Please refer to the Current Medication list given to you today.  *If you need a refill on your cardiac medications before your next appointment, please call your pharmacy*  Lab Work: NONE If you have labs (blood work) drawn today and your tests are completely normal, you will receive your results only by: MyChart Message (if you have MyChart) OR A paper copy in the mail If you have any lab test that is abnormal or we need to change your treatment, we will call you to review the results.  Testing/Procedures: Coronary Calcium Score Your physician has requested that you have a coronary calcium score performed. This is not covered by insurance and will be an out-of-pocket cost of approximately $99.   Echocardiogram Your physician has requested that you have an echocardiogram. Echocardiography is a painless test that uses sound waves to create images of your heart. It provides your doctor with information about the size and shape of your heart and how well your heart's chambers and valves are working. This procedure takes approximately one hour. There are no restrictions for this procedure. Please do NOT wear cologne, perfume, aftershave, or lotions (deodorant is allowed). Please arrive 15 minutes prior to your appointment time.  Please note: We ask at that you not bring children with you during ultrasound (echo/ vascular) testing. Due to room size and safety concerns, children are not allowed in the ultrasound rooms during exams. Our front office staff cannot provide observation of children in our lobby area while testing is being conducted. An adult accompanying a patient to their appointment will only be allowed in the ultrasound room at the discretion of the ultrasound technician under special circumstances. We apologize for any inconvenience.  3 Day Zio Heart Monitor Your physician  has requested that you wear a Zio heart monitor for __3___ days. This will be mailed to your home with instructions on how to apply the monitor and how to return it when finished. Please allow 2 weeks after returning the heart monitor before our office calls you with the results.   Follow-Up: At [facility], you and your health needs are our priority.  As part of our continuing mission to provide you with exceptional heart care, our providers are all part of one team.  This team includes your primary Cardiologist (physician) and Advanced Practice Providers or APPs (Physician Assistants and Nurse Practitioners) who all work together to provide you with the care you need, when you need it.  Your next appointment:   As needed  Provider:   Ren, MD  We recommend signing up for the patient portal called MyChart.  Sign up information is provided on this After Visit Summary.  MyChart is used to connect with patients for Virtual Visits (Telemedicine).  Patients are able to view lab/test results, encounter notes, upcoming appointments, etc.  Non-urgent messages can be sent to your provider as well.   To learn more about what you can do with MyChart, go to [email or web address].   Other Instructions ZIO XT- Long Term Monitor Instructions  Your physician has requested you wear a ZIO patch monitor for 3 days.  This is a single patch monitor. [employer] supplies one patch monitor per enrollment. Additional stickers are not available. Please do not apply patch if you will be having a Nuclear Stress Test,  Echocardiogram, Cardiac CT, MRI, or Chest Xray during the period you would be wearing the  monitor. The patch cannot be worn during these tests. You cannot remove and re-apply the  ZIO XT patch monitor.  Your ZIO patch monitor will be mailed 3 day USPS to your address on file. It may take 3-5 days  to receive your monitor after you have been enrolled.  Once you have received your monitor,  please review the enclosed instructions. Your monitor  has already been registered assigning a specific monitor serial # to you.  Billing and Patient Assistance Program Information  We have supplied [employer] with any of your insurance information on file for billing purposes. [employer] offers a sliding scale Patient Assistance Program for patients that do not have  insurance, or whose insurance does not completely cover the cost of the ZIO monitor.  You must apply for the Patient Assistance Program to qualify for this discounted rate.  To apply, please call [employer] at [phone], select option 4, select option 2, ask to apply for  Patient Assistance Program. Meredeth will ask your household income, and how many people  are in your household. They will quote your out-of-pocket cost based on that information.  [employer] will also be able to set up a 58-month, interest-free payment plan if needed.  Applying the monitor   Shave hair from upper left chest.  Hold abrader disc by orange tab. Rub abrader in 40 strokes over the upper left chest as  indicated in your monitor instructions.  Clean area with 4 enclosed alcohol pads. Let dry.  Apply patch as indicated in monitor instructions. Patch will be placed under collarbone on left  side of chest with arrow pointing upward.  Rub patch adhesive wings for 2 minutes. Remove white label marked 1. Remove the white  label marked 2. Rub patch adhesive wings for 2 additional minutes.  While looking in a mirror, press and release button in center of patch. A small green light will  flash 3-4 times. This will be your only indicator that the monitor has been turned on.  Do not shower for the first 24 hours. You may shower after the first 24 hours.  Press the button if you feel a symptom. You will hear a small click. Record Date, Time and  Symptom in the Patient Logbook.  When you are ready to remove the patch, follow instructions on the last 2 pages of  Patient  Logbook. Stick patch monitor onto the last page of Patient Logbook.  Place Patient Logbook in the blue and white box. Use locking tab on box and tape box closed  securely. The blue and white box has prepaid postage on it. Please place it in the mailbox as  soon as possible. Your physician should have your test results approximately 7 days after the  monitor has been mailed back to [facility].  Call [facility] Customer Care at [phone] if you have questions regarding  your ZIO XT patch monitor. Call them immediately if you see an orange light blinking on your  monitor.  If your monitor falls off in less than 4 days, contact our Monitor department at [phone].  If your monitor becomes loose or falls off after 4 days call [employer] at [phone] for  suggestions on securing your monitor

## 2024-09-17 ENCOUNTER — Ambulatory Visit: Payer: Self-pay

## 2024-09-17 DIAGNOSIS — R002 Palpitations: Secondary | ICD-10-CM | POA: Diagnosis not present

## 2024-09-26 ENCOUNTER — Ambulatory Visit (HOSPITAL_COMMUNITY)
Admission: RE | Admit: 2024-09-26 | Discharge: 2024-09-26 | Disposition: A | Discharge status: Home / Self Care | Source: Ambulatory Visit

## 2024-09-26 ENCOUNTER — Encounter (INDEPENDENT_AMBULATORY_CARE_PROVIDER_SITE_OTHER): Payer: Self-pay

## 2024-09-26 ENCOUNTER — Ambulatory Visit (HOSPITAL_COMMUNITY)
Admission: RE | Admit: 2024-09-26 | Discharge: 2024-09-26 | Disposition: A | Discharge status: Home / Self Care | Payer: Self-pay | Source: Ambulatory Visit

## 2024-09-26 DIAGNOSIS — R0602 Shortness of breath: Secondary | ICD-10-CM | POA: Diagnosis not present

## 2024-09-26 DIAGNOSIS — R0789 Other chest pain: Secondary | ICD-10-CM | POA: Insufficient documentation

## 2024-09-26 LAB — ECHOCARDIOGRAM COMPLETE
Area-P 1/2: 4.21 cm2
S' Lateral: 2.5 cm
